# Patient Record
Sex: Female | Born: 1975 | Race: Black or African American | Hispanic: No | Marital: Married | Smoking: Never smoker
Health system: Southern US, Community
[De-identification: ages and names within clinical notes are randomized; demographics above are authoritative.]

## PROBLEM LIST (undated history)

## (undated) DIAGNOSIS — D509 Iron deficiency anemia, unspecified: Secondary | ICD-10-CM

---

## 2019-04-26 ENCOUNTER — Encounter: Payer: Self-pay | Admitting: Intensive Care

## 2019-04-26 ENCOUNTER — Emergency Department
Admission: EM | Admit: 2019-04-26 | Discharge: 2019-04-26 | Disposition: A | Discharge status: Home / Self Care | Attending: Emergency Medicine | Admitting: Emergency Medicine

## 2019-04-26 ENCOUNTER — Other Ambulatory Visit: Payer: Self-pay

## 2019-04-26 ENCOUNTER — Emergency Department

## 2019-04-26 DIAGNOSIS — K2970 Gastritis, unspecified, without bleeding: Secondary | ICD-10-CM | POA: Insufficient documentation

## 2019-04-26 DIAGNOSIS — K8020 Calculus of gallbladder without cholecystitis without obstruction: Secondary | ICD-10-CM | POA: Diagnosis not present

## 2019-04-26 DIAGNOSIS — K2900 Acute gastritis without bleeding: Secondary | ICD-10-CM

## 2019-04-26 DIAGNOSIS — R1013 Epigastric pain: Secondary | ICD-10-CM | POA: Diagnosis present

## 2019-04-26 LAB — URINALYSIS, COMPLETE (UACMP) WITH MICROSCOPIC
Bilirubin Urine: NEGATIVE
Glucose, UA: NEGATIVE mg/dL
Ketones, ur: NEGATIVE mg/dL
Nitrite: NEGATIVE
Protein, ur: 30 mg/dL — AB
Specific Gravity, Urine: 1.02 (ref 1.005–1.030)
pH: 5 (ref 5.0–8.0)

## 2019-04-26 LAB — COMPREHENSIVE METABOLIC PANEL
ALT: 14 U/L (ref 0–44)
AST: 16 U/L (ref 15–41)
Albumin: 4.3 g/dL (ref 3.5–5.0)
Alkaline Phosphatase: 76 U/L (ref 38–126)
Anion gap: 11 (ref 5–15)
BUN: 8 mg/dL (ref 6–20)
CO2: 25 mmol/L (ref 22–32)
Calcium: 9.4 mg/dL (ref 8.9–10.3)
Chloride: 103 mmol/L (ref 98–111)
Creatinine, Ser: 0.65 mg/dL (ref 0.44–1.00)
GFR calc Af Amer: >60 mL/min (ref >60)
GFR calc non Af Amer: >60 mL/min (ref >60)
Glucose, Bld: 147 mg/dL — ABNORMAL HIGH (ref 70–99)
Potassium: 3.5 mmol/L (ref 3.5–5.1)
Sodium: 139 mmol/L (ref 135–145)
Total Bilirubin: 0.4 mg/dL (ref 0.3–1.2)
Total Protein: 8.3 g/dL — ABNORMAL HIGH (ref 6.5–8.1)

## 2019-04-26 LAB — CBC
HCT: 34.8 % — ABNORMAL LOW (ref 36.0–46.0)
Hemoglobin: 11 g/dL — ABNORMAL LOW (ref 12.0–15.0)
MCH: 25.3 pg — ABNORMAL LOW (ref 26.0–34.0)
MCHC: 31.6 g/dL (ref 30.0–36.0)
MCV: 80 fL (ref 80.0–100.0)
Platelets: 430 10*3/uL — ABNORMAL HIGH (ref 150–400)
RBC: 4.35 MIL/uL (ref 3.87–5.11)
RDW: 15.5 % (ref 11.5–15.5)
WBC: 13.3 10*3/uL — ABNORMAL HIGH (ref 4.0–10.5)
nRBC: 0 % (ref 0.0–0.2)

## 2019-04-26 LAB — POCT PREGNANCY, URINE: Preg Test, Ur: NEGATIVE

## 2019-04-26 LAB — LIPASE, BLOOD: Lipase: 24 U/L (ref 11–51)

## 2019-04-26 MED ORDER — MORPHINE SULFATE (PF) 4 MG/ML IV SOLN
4.0000 mg | Freq: Once | INTRAVENOUS | Status: AC
  Administered 2019-04-26: 4 mg via INTRAVENOUS
  Filled 2019-04-26: qty 1

## 2019-04-26 MED ORDER — PANTOPRAZOLE SODIUM 20 MG PO TBEC: 20.0000 mg | DELAYED_RELEASE_TABLET | Freq: Every day | ORAL | 1 refills | Status: DC

## 2019-04-26 MED ORDER — LIDOCAINE VISCOUS HCL 2 % MT SOLN
15.0000 mL | Freq: Once | OROMUCOSAL | Status: AC
  Administered 2019-04-26: 15 mL via ORAL
  Filled 2019-04-26: qty 15

## 2019-04-26 MED ORDER — ONDANSETRON HCL 4 MG/2ML IJ SOLN
4.0000 mg | Freq: Once | INTRAMUSCULAR | Status: AC
  Administered 2019-04-26: 4 mg via INTRAVENOUS
  Filled 2019-04-26: qty 2

## 2019-04-26 MED ORDER — ALUM & MAG HYDROXIDE-SIMETH 200-200-20 MG/5ML PO SUSP
30.0000 mL | Freq: Once | ORAL | Status: AC
  Administered 2019-04-26: 30 mL via ORAL
  Filled 2019-04-26: qty 30

## 2019-04-26 NOTE — ED Provider Notes (Signed)
[facility] Emergency Department Provider Note   ____________________________________________    I have reviewed the triage vital signs and the nursing notes.   HISTORY  Chief Complaint Abdominal Pain and Emesis     HPI Ann Stevens is a 43 y.o. female who presents with complaints of abdominal pain, nausea and vomiting.  Patient describes the pain is in her epigastrium and started around 3 AM, she has had multiple episodes of vomiting since then.  She reports she is had a pain in this area a couple of times a month for some time always at night but usually goes away.  Denies a history of gallstones as far she knows.  Has not taken anything for this.  Describes burning pain in her epigastrium which radiates to her back.  Occasional alcohol use, non-smoker  History reviewed. No pertinent past medical history.  There are no active problems to display for this patient.   History reviewed. No pertinent surgical history.  Prior to Admission medications   Medication Sig Start Date End Date Taking? Authorizing Provider  pantoprazole (PROTONIX) 20 MG tablet Take 1 tablet (20 mg total) by mouth daily. 04/26/19 04/25/20  Lavonia Drafts, MD     Allergies Patient has no known allergies.  History reviewed. No pertinent family history.  Social History Social History   Tobacco Use  . Smoking status: Never Smoker  . Smokeless tobacco: Never Used  Substance Use Topics  . Alcohol use: Yes    Comment: occ  . Drug use: Never    Review of Systems  Constitutional: No fever/chills Eyes: No visual changes.  ENT: No sore throat. Cardiovascular: Denies chest pain. Respiratory: Denies shortness of breath. Gastrointestinal: As above Genitourinary: Negative for dysuria. Musculoskeletal: Negative for back pain. Skin: Negative for rash. Neurological: Negative for headaches or weakness   ____________________________________________   PHYSICAL EXAM:   VITAL SIGNS: ED Triage Vitals  Enc Vitals Group     BP 04/26/19 0711 (!) 151/101     Pulse Rate 04/26/19 0711 88     Resp 04/26/19 0711 16     Temp 04/26/19 0711 98.2 F (36.8 C)     Temp Source 04/26/19 0711 Oral     SpO2 04/26/19 0711 99 %     Weight 04/26/19 0707 81.6 kg (180 lb)     Height 04/26/19 0707 1.626 m (5\' 4" )     Head Circumference --      Peak Flow --      Pain Score 04/26/19 0706 10     Pain Loc --      Pain Edu? --      Excl. in [place]? --     Constitutional: Alert and oriented.  Eyes: Conjunctivae are normal.   Nose: No congestion/rhinnorhea. Mouth/Throat: Mucous membranes are moist.    Cardiovascular: Normal rate, regular rhythm. Grossly normal heart sounds.  Good peripheral circulation. Respiratory: Normal respiratory effort.  No retractions. Lungs CTAB. Gastrointestinal: Mild epigastric tenderness to palpation, soft overall, no distention, no CVA tenderness, no right upper quadrant tenderness  Musculoskeletal:   Warm and well perfused Neurologic:  Normal speech and language. No gross focal neurologic deficits are appreciated.  Skin:  Skin is warm, dry and intact. No rash noted. Psychiatric: Mood and affect are normal. Speech and behavior are normal.  ____________________________________________   LABS (all labs ordered are listed, but only abnormal results are displayed)  Labs Reviewed  COMPREHENSIVE METABOLIC PANEL - Abnormal; Notable for the following components:  Result Value   Glucose, Bld 147 (*)    Total Protein 8.3 (*)    All other components within normal limits  CBC - Abnormal; Notable for the following components:   WBC 13.3 (*)    Hemoglobin 11.0 (*)    HCT 34.8 (*)    MCH 25.3 (*)    Platelets 430 (*)    All other components within normal limits  URINALYSIS, COMPLETE (UACMP) WITH MICROSCOPIC - Abnormal; Notable for the following components:   Color, Urine YELLOW (*)    APPearance CLOUDY (*)    Hgb urine dipstick SMALL (*)     Protein, ur 30 (*)    Leukocytes,Ua MODERATE (*)    Bacteria, UA RARE (*)    All other components within normal limits  LIPASE, BLOOD  POC URINE PREG, ED  POCT PREGNANCY, URINE   ____________________________________________  EKG  None ____________________________________________  RADIOLOGY  Ultrasound demonstrates cholelithiasis, no cholecystitis ____________________________________________   PROCEDURES  Procedure(s) performed: No  Procedures   Critical Care performed: No ____________________________________________   INITIAL IMPRESSION / ASSESSMENT AND PLAN / ED COURSE  Pertinent labs & imaging results that were available during my care of the patient were reviewed by me and considered in my medical decision making (see chart for details).  Patient presents with epigastric pain as noted above.  Differential includes GERD/gastritis/PUD.  Less likely pancreatitis or cholelithiasis.  We will give GI cocktail, pending labs  Little improvement with GI cocktail however significant provement with IV morphine and IV Zofran.  Ultrasound demonstrates cholelithiasis.  She is asymptomatic at this point, symptoms may be related to biliary colic versus gastritis/PUD.  I will refer to GI, start her on PPI    ____________________________________________   FINAL CLINICAL IMPRESSION(S) / ED DIAGNOSES  Final diagnoses:  Epigastric pain  Calculus of gallbladder without cholecystitis without obstruction  Acute gastritis without hemorrhage, unspecified gastritis type        Note:  This document was prepared using Dragon voice recognition software and may include unintentional dictation errors.   Lavonia Drafts, MD 04/26/19 [phone]

## 2019-04-26 NOTE — ED Triage Notes (Signed)
Patient c/o abd pain with emesis since 0300 04/26/19

## 2019-04-26 NOTE — ED Notes (Signed)
Pt aware of need for urine, unable to obtain at this time

## 2019-04-26 NOTE — ED Notes (Signed)
Patient transported to Ultrasound 

## 2019-06-14 ENCOUNTER — Other Ambulatory Visit: Payer: Self-pay

## 2019-06-14 ENCOUNTER — Ambulatory Visit (INDEPENDENT_AMBULATORY_CARE_PROVIDER_SITE_OTHER): Admitting: Gastroenterology

## 2019-06-14 ENCOUNTER — Encounter: Payer: Self-pay | Admitting: Gastroenterology

## 2019-06-14 ENCOUNTER — Encounter

## 2019-06-14 VITALS — BP 134/70 | HR 79 | Temp 99.0°F | Ht 64.0 in | Wt 191.8 lb

## 2019-06-14 DIAGNOSIS — K5900 Constipation, unspecified: Secondary | ICD-10-CM

## 2019-06-14 DIAGNOSIS — R12 Heartburn: Secondary | ICD-10-CM

## 2019-06-14 DIAGNOSIS — R1011 Right upper quadrant pain: Secondary | ICD-10-CM

## 2019-06-14 DIAGNOSIS — G8929 Other chronic pain: Secondary | ICD-10-CM

## 2019-06-14 DIAGNOSIS — D509 Iron deficiency anemia, unspecified: Secondary | ICD-10-CM | POA: Diagnosis not present

## 2019-06-14 MED ORDER — PANTOPRAZOLE SODIUM 40 MG PO TBEC: 40.0000 mg | DELAYED_RELEASE_TABLET | Freq: Every day | ORAL | 3 refills | Status: DC

## 2019-06-14 NOTE — Patient Instructions (Signed)

## 2019-06-14 NOTE — Progress Notes (Signed)
Jonathon Bellows MD, MRCP(U.K) [address]  [place]  [place], [place] [address]  Main: [phone]  Fax: [phone]   Gastroenterology Consultation  Referring Provider:     No ref. provider found Primary Care Physician:  Burnard Hawthorne, FNP Primary Gastroenterologist:  Dr. Jonathon Bellows  Reason for Consultation:     Abdominal pain         HPI:   Ann Stevens is a 43 y.o. y/o female referred to see me after ER visit om 04/26/2019 with abdominal pain.   Right upper quadrant ultrasound: Moderate cholelithiasis, hepatic steatosis. 04/26/2019: Urinalysis shows features of UTI.  Hemoglobin of 11 with MCV of 80.  LFTs normal, lipase normal She says she has had heartburn and right upper quadrant pain for the past 1 year.  Gradually getting worse.  The heartburn is usually at night which wakes her up from sleep.  She initially took some Protonix for 30 days and stopped.  While on Protonix her heartburn had resolved.  She continues to have on and off right upper quadrant pain, localized, spasmic, nonradiating, no clear aggravating or relieving factors.  She does not have a bowel movement every day.  Does not claim she is constipated.  Does not have any relief with an adequate bowel movement.  Some gas and bloating.  Denies any regular NSAID use.  She had an aunt who had issues with her gallbladder.  She has her cycles every 3 to 4 weeks.  Very heavy initially and has to change multiple pads and tampons.  Denies any rectal bleeding.  Denies any blood in the urine.  She is on iron tablets.  She has not seen a GYN.   No past medical history on file.  No past surgical history on file.  Prior to Admission medications   Medication Sig Start Date End Date Taking? Authorizing Provider  pantoprazole (PROTONIX) 20 MG tablet Take 1 tablet (20 mg total) by mouth daily. 04/26/19 04/25/20  Lavonia Drafts, MD    No family history on file.   Social History   Tobacco Use  . Smoking status: Never  Smoker  . Smokeless tobacco: Never Used  Substance Use Topics  . Alcohol use: Yes    Comment: occ  . Drug use: Never    Allergies as of 06/14/2019  . (No Known Allergies)    Review of Systems:    All systems reviewed and negative except where noted in HPI.   Physical Exam:  BP 134/70   Pulse 79   Temp 99 F (37.2 C)   Ht 5\' 4"  (1.626 m)   Wt 191 lb 12.8 oz (87 kg)   BMI 32.92 kg/m  No LMP recorded. (Menstrual status: Oral contraceptives). Psych:  Alert and cooperative. Normal mood and affect. General:   Alert,  Well-developed, well-nourished, pleasant and cooperative in NAD Head:  Normocephalic and atraumatic. Eyes:  Sclera clear, no icterus.   Conjunctiva pink. Ears:  Normal auditory acuity. Nose:  No deformity, discharge, or lesions. Mouth:  No deformity or lesions,oropharynx pink & moist. Neck:  Supple; no masses or thyromegaly. Lungs:  Respirations even and unlabored.  Clear throughout to auscultation.   No wheezes, crackles, or rhonchi. No acute distress. Heart:  Regular rate and rhythm; no murmurs, clicks, rubs, or gallops. Abdomen:  Normal bowel sounds.  No bruits.  Soft, non-tender and non-distended without masses, hepatosplenomegaly or hernias noted.  No guarding or rebound tenderness.    Neurologic:  Alert and oriented x3;  grossly normal neurologically. Skin:  Intact without significant lesions or rashes. No jaundice. Lymph Nodes:  No significant cervical adenopathy. Psych:  Alert and cooperative. Normal mood and affect.  Imaging Studies: No results found.  Assessment and Plan:   Ann Stevens is a 43 y.o. y/o female has been referred for abdominal pain after recent ER visit.  Based on her history I suspect she has 2 issues: Heartburn likely due to acid reflux.  Discussed lifestyle changes.,  Commence on Protonix 40 mg once a day before breakfast as a trial.  She does have right upper quadrant pain which is very nonspecific.  Does not have a typical biliary  description.  It may be related to gallbladder or from constipation.  I would suggest to recommend 1 capful of MiraLAX daily.  If no better in 4 weeks time consider HIDA scan.  She has microcytic anemia.  Based on history likely menorrhagia.  I will check B12 folate and iron studies.  If low I would recommend replacement as well as referral to GYN.  I also provide information on a high-fiber diet  Follow up in 4 weeks video visit  Dr Jonathon Bellows MD,MRCP(U.K)

## 2019-06-15 ENCOUNTER — Encounter: Payer: Self-pay | Admitting: Gastroenterology

## 2019-06-15 ENCOUNTER — Other Ambulatory Visit: Payer: Self-pay

## 2019-06-15 ENCOUNTER — Telehealth: Payer: Self-pay

## 2019-06-15 DIAGNOSIS — D509 Iron deficiency anemia, unspecified: Secondary | ICD-10-CM

## 2019-06-15 LAB — B12 AND FOLATE PANEL
Folate: 12.3 ng/mL (ref >3.0)
Vitamin B-12: 407 pg/mL (ref 232–1245)

## 2019-06-15 LAB — IRON,TIBC AND FERRITIN PANEL
Ferritin: 18 ng/mL (ref 15–150)
Iron Saturation: 5 % — CL (ref 15–55)
Iron: 23 ug/dL — ABNORMAL LOW (ref 27–159)
Total Iron Binding Capacity: 419 ug/dL (ref 250–450)
UIBC: 396 ug/dL (ref 131–425)

## 2019-06-15 MED ORDER — FERROUS SULFATE 325 (65 FE) MG PO TBEC: 325.0000 mg | DELAYED_RELEASE_TABLET | Freq: Two times a day (BID) | ORAL | 1 refills | Status: DC

## 2019-06-15 NOTE — Progress Notes (Signed)
Jadijah Inform that the iron is very low, she needs to take her iron tablets daily ferrous sulfate 325 mg twice daily.  I would also suggest her to see a GYN as she has significant menorrhagia.  Dr Jonathon Bellows MD,MRCP [facility]) Gastroenterology/Hepatology Pager: [phone]

## 2019-06-15 NOTE — Telephone Encounter (Signed)
-----   Message from Jonathon Bellows, MD sent at 06/15/2019  9:51 AM EST ----- Ann Stevens Inform that the iron is very low, she needs to take her iron tablets daily ferrous sulfate 325 mg twice daily.  I would also suggest her to see a GYN as she has significant menorrhagia.  Dr Jonathon Bellows MD,MRCP [facility]) Gastroenterology/Hepatology Pager: [phone]

## 2019-06-15 NOTE — Telephone Encounter (Signed)
Spoke with pt and informed her of lab results and Dr. Georgeann Oppenheim recommendations. Pt agrees and requests a prescription for the iron tablets. Pt has also agreed to a referral to [facility].

## 2019-06-21 ENCOUNTER — Telehealth: Payer: Self-pay | Admitting: Obstetrics & Gynecology

## 2019-06-21 NOTE — Telephone Encounter (Signed)
[facility] referring for Iron deficiency anemia, unspecified iron deficiency anemia type. Attempt to reach patient no voicemail set up unable to leave message.

## 2019-07-06 ENCOUNTER — Telehealth: Payer: Self-pay

## 2019-07-06 NOTE — Telephone Encounter (Signed)
Pt called requesting Dr. Georgeann Oppenheim advice. Pt states yesterday she began experiencing a constant dull ache in her mid upper abdomen that radiates into her back. Pt states her last bowel movement was yesterday, soft stool, no diarrhea. She's taking Miralax daily for constipation and is also taking daily protonix. No nausea or vomiting. Pt requests advice on what she should do. I explained that I will relay this to Dr. Vicente Males for recommendations.

## 2019-07-07 ENCOUNTER — Other Ambulatory Visit: Payer: Self-pay

## 2019-07-07 DIAGNOSIS — G8929 Other chronic pain: Secondary | ICD-10-CM

## 2019-07-07 DIAGNOSIS — R1011 Right upper quadrant pain: Secondary | ICD-10-CM

## 2019-07-07 DIAGNOSIS — R1010 Upper abdominal pain, unspecified: Secondary | ICD-10-CM

## 2019-07-07 MED ORDER — DICYCLOMINE HCL 10 MG PO CAPS: 10.0000 mg | ORAL_CAPSULE | Freq: Three times a day (TID) | ORAL | 2 refills | Status: DC

## 2019-07-07 NOTE — Telephone Encounter (Signed)
Check if on PPI, if yes then [proceed with HIDA, give bentyl PRN upto 4 times daily for pain 10 mg .   If no better in 4-5 days then video visit next Wednesday with me   Bailey Mech

## 2019-07-07 NOTE — Telephone Encounter (Signed)
Spoke with pt and informed her of Dr. Georgeann Oppenheim recommendations. Pt agrees. Pt is aware that the Bentyl prescription has been sent to pharmacy and HIDA scan appointment has been scheduled.

## 2019-07-08 ENCOUNTER — Other Ambulatory Visit: Payer: Self-pay

## 2019-07-08 ENCOUNTER — Encounter: Payer: Self-pay | Admitting: Emergency Medicine

## 2019-07-08 ENCOUNTER — Emergency Department

## 2019-07-08 ENCOUNTER — Inpatient Hospital Stay
Admission: EM | Admit: 2019-07-08 | Discharge: 2019-07-11 | DRG: 417 | Disposition: A | Discharge status: Home / Self Care | Attending: Internal Medicine | Admitting: Internal Medicine

## 2019-07-08 DIAGNOSIS — K8000 Calculus of gallbladder with acute cholecystitis without obstruction: Principal | ICD-10-CM | POA: Diagnosis present

## 2019-07-08 DIAGNOSIS — K8043 Calculus of bile duct with acute cholecystitis with obstruction: Secondary | ICD-10-CM

## 2019-07-08 DIAGNOSIS — K851 Biliary acute pancreatitis without necrosis or infection: Secondary | ICD-10-CM | POA: Diagnosis present

## 2019-07-08 DIAGNOSIS — R109 Unspecified abdominal pain: Secondary | ICD-10-CM

## 2019-07-08 DIAGNOSIS — E669 Obesity, unspecified: Secondary | ICD-10-CM | POA: Diagnosis present

## 2019-07-08 DIAGNOSIS — Z98891 History of uterine scar from previous surgery: Secondary | ICD-10-CM | POA: Diagnosis not present

## 2019-07-08 DIAGNOSIS — Z6829 Body mass index (BMI) 29.0-29.9, adult: Secondary | ICD-10-CM

## 2019-07-08 DIAGNOSIS — E860 Dehydration: Secondary | ICD-10-CM | POA: Diagnosis present

## 2019-07-08 DIAGNOSIS — I959 Hypotension, unspecified: Secondary | ICD-10-CM | POA: Diagnosis present

## 2019-07-08 DIAGNOSIS — D509 Iron deficiency anemia, unspecified: Secondary | ICD-10-CM | POA: Diagnosis present

## 2019-07-08 DIAGNOSIS — K810 Acute cholecystitis: Secondary | ICD-10-CM | POA: Diagnosis present

## 2019-07-08 DIAGNOSIS — Z20828 Contact with and (suspected) exposure to other viral communicable diseases: Secondary | ICD-10-CM | POA: Diagnosis present

## 2019-07-08 DIAGNOSIS — Z79899 Other long term (current) drug therapy: Secondary | ICD-10-CM

## 2019-07-08 DIAGNOSIS — K219 Gastro-esophageal reflux disease without esophagitis: Secondary | ICD-10-CM | POA: Diagnosis present

## 2019-07-08 DIAGNOSIS — R1011 Right upper quadrant pain: Secondary | ICD-10-CM | POA: Diagnosis present

## 2019-07-08 DIAGNOSIS — K760 Fatty (change of) liver, not elsewhere classified: Secondary | ICD-10-CM | POA: Diagnosis present

## 2019-07-08 DIAGNOSIS — Z419 Encounter for procedure for purposes other than remedying health state, unspecified: Secondary | ICD-10-CM

## 2019-07-08 DIAGNOSIS — K859 Acute pancreatitis without necrosis or infection, unspecified: Secondary | ICD-10-CM

## 2019-07-08 DIAGNOSIS — K8020 Calculus of gallbladder without cholecystitis without obstruction: Secondary | ICD-10-CM

## 2019-07-08 HISTORY — DX: Iron deficiency anemia, unspecified: D50.9

## 2019-07-08 LAB — COMPREHENSIVE METABOLIC PANEL
ALT: 154 U/L — ABNORMAL HIGH (ref 0–44)
AST: 97 U/L — ABNORMAL HIGH (ref 15–41)
Albumin: 4 g/dL (ref 3.5–5.0)
Alkaline Phosphatase: 131 U/L — ABNORMAL HIGH (ref 38–126)
Anion gap: 14 (ref 5–15)
BUN: 11 mg/dL (ref 6–20)
CO2: 22 mmol/L (ref 22–32)
Calcium: 9.2 mg/dL (ref 8.9–10.3)
Chloride: 102 mmol/L (ref 98–111)
Creatinine, Ser: 0.7 mg/dL (ref 0.44–1.00)
GFR calc Af Amer: >60 mL/min (ref >60)
GFR calc non Af Amer: >60 mL/min (ref >60)
Glucose, Bld: 112 mg/dL — ABNORMAL HIGH (ref 70–99)
Potassium: 3.7 mmol/L (ref 3.5–5.1)
Sodium: 138 mmol/L (ref 135–145)
Total Bilirubin: 4.5 mg/dL — ABNORMAL HIGH (ref 0.3–1.2)
Total Protein: 8.5 g/dL — ABNORMAL HIGH (ref 6.5–8.1)

## 2019-07-08 LAB — CBC
HCT: 39.5 % (ref 36.0–46.0)
Hemoglobin: 12.2 g/dL (ref 12.0–15.0)
MCH: 24.8 pg — ABNORMAL LOW (ref 26.0–34.0)
MCHC: 30.9 g/dL (ref 30.0–36.0)
MCV: 80.3 fL (ref 80.0–100.0)
Platelets: 354 10*3/uL (ref 150–400)
RBC: 4.92 MIL/uL (ref 3.87–5.11)
RDW: 17.1 % — ABNORMAL HIGH (ref 11.5–15.5)
WBC: 9.9 10*3/uL (ref 4.0–10.5)
nRBC: 0 % (ref 0.0–0.2)

## 2019-07-08 LAB — URINALYSIS, COMPLETE (UACMP) WITH MICROSCOPIC
Bacteria, UA: NONE SEEN
Bilirubin Urine: NEGATIVE
Glucose, UA: NEGATIVE mg/dL
Ketones, ur: 80 mg/dL — AB
Nitrite: NEGATIVE
Protein, ur: NEGATIVE mg/dL
Specific Gravity, Urine: 1.018 (ref 1.005–1.030)
pH: 5 (ref 5.0–8.0)

## 2019-07-08 LAB — RESPIRATORY PANEL BY RT PCR (FLU A&B, COVID)
Influenza A by PCR: NEGATIVE
Influenza B by PCR: NEGATIVE
SARS Coronavirus 2 by RT PCR: NEGATIVE

## 2019-07-08 LAB — POCT PREGNANCY, URINE: Preg Test, Ur: NEGATIVE

## 2019-07-08 LAB — LIPASE, BLOOD: Lipase: 2287 U/L — ABNORMAL HIGH (ref 11–51)

## 2019-07-08 MED ORDER — GADOBUTROL 1 MMOL/ML IV SOLN
7.5000 mL | Freq: Once | INTRAVENOUS | Status: AC | PRN
  Administered 2019-07-08: 7.5 mL via INTRAVENOUS

## 2019-07-08 MED ORDER — HYDROMORPHONE HCL 1 MG/ML IJ SOLN
1.0000 mg | INTRAMUSCULAR | Status: DC | PRN
  Administered 2019-07-08 – 2019-07-10 (×8): 1 mg via INTRAVENOUS
  Filled 2019-07-08 (×8): qty 1

## 2019-07-08 MED ORDER — ONDANSETRON HCL 4 MG/2ML IJ SOLN
4.0000 mg | Freq: Once | INTRAMUSCULAR | Status: AC
  Administered 2019-07-08: 4 mg via INTRAVENOUS
  Filled 2019-07-08: qty 2

## 2019-07-08 MED ORDER — PIPERACILLIN-TAZOBACTAM 3.375 G IVPB 30 MIN: 3.3750 g | Freq: Three times a day (TID) | INTRAVENOUS | Status: DC

## 2019-07-08 MED ORDER — ONDANSETRON HCL 4 MG PO TABS
4.0000 mg | ORAL_TABLET | Freq: Four times a day (QID) | ORAL | Status: DC | PRN
  Filled 2019-07-08: qty 1

## 2019-07-08 MED ORDER — SODIUM CHLORIDE 0.9 % IV SOLN: Freq: Once | INTRAVENOUS | Status: AC

## 2019-07-08 MED ORDER — LACTATED RINGERS IV SOLN: INTRAVENOUS | Status: DC

## 2019-07-08 MED ORDER — MORPHINE SULFATE (PF) 4 MG/ML IV SOLN
4.0000 mg | Freq: Once | INTRAVENOUS | Status: AC
  Administered 2019-07-08: 4 mg via INTRAVENOUS
  Filled 2019-07-08: qty 1

## 2019-07-08 MED ORDER — SODIUM CHLORIDE 0.9 % IV BOLUS
1000.0000 mL | Freq: Once | INTRAVENOUS | Status: AC
  Administered 2019-07-08: 1000 mL via INTRAVENOUS

## 2019-07-08 MED ORDER — PIPERACILLIN-TAZOBACTAM 3.375 G IVPB 30 MIN
3.3750 g | Freq: Once | INTRAVENOUS | Status: AC
  Administered 2019-07-08: 3.375 g via INTRAVENOUS
  Filled 2019-07-08: qty 50

## 2019-07-08 MED ORDER — ENOXAPARIN SODIUM 40 MG/0.4ML ~~LOC~~ SOLN
40.0000 mg | SUBCUTANEOUS | Status: DC
  Administered 2019-07-08 – 2019-07-10 (×3): 40 mg via SUBCUTANEOUS
  Filled 2019-07-08 (×2): qty 0.4

## 2019-07-08 MED ORDER — ONDANSETRON HCL 4 MG/2ML IJ SOLN
4.0000 mg | Freq: Four times a day (QID) | INTRAMUSCULAR | Status: DC | PRN
  Administered 2019-07-10: 4 mg via INTRAVENOUS

## 2019-07-08 MED ORDER — FENTANYL CITRATE (PF) 100 MCG/2ML IJ SOLN
100.0000 ug | Freq: Once | INTRAMUSCULAR | Status: AC
  Administered 2019-07-08: 100 ug via INTRAVENOUS
  Filled 2019-07-08: qty 2

## 2019-07-08 NOTE — ED Provider Notes (Signed)
MRCP returned and showed signs of acute pancreatitis as well as possible gallstones or sludge in the common bile duct.  I discussed with Dr. Marius Ditch with GI about these findings. At this point no concern for cholangitis. Given that preference would be to help treat the pancreatic inflammation before ERCP she felt comfortable keep the patient here at [facility]  I discussed this with the patient.  I also discussed possibility of going to center with ERCP if she was to need a more emergent ERCP.  She felt comfortable being admitted here and then having a slight delay of transfer is needed later.  Will plan on admission here.   Nance Pear, MD 07/08/19 [phone]

## 2019-07-08 NOTE — H&P (Signed)
History and Physical   Ann Stevens [id number] DOB: 02-11-1976 DOA: 07/08/2019  Referring MD/NP/PA: Dr. Archie Balboa  PCP: Burnard Hawthorne, FNP   Outpatient Specialists: None  Patient coming from: Home  Chief Complaint: Abdominal pain  HPI: Ann Stevens is a 43 y.o. female with medical history significant of iron deficiency anemia who presents to the ER with sudden onset of right upper quadrant abdominal pain in the last 2 days.  She had similar symptoms 2 months ago but resolved spontaneously.  But this time around pain was as high as 9 out of 10.  Associated with some nausea and vomiting.  Denied any fever.  Denied any diarrhea,.  Patient also denied any sick contact.  Denied any prior abdominal surgeries.  Denied taking nonsteroidal anti-inflammatory agents.  No melena or bright red blood per rectum and no hematemesis.  She was seen in the ER and evaluated.  Patient had markedly elevated lipase enzyme with imaging showing acute pancreatitis with choledocholithiasis.  GI consulted and recommendations to admit patient for possible ERCP on Monday..  ED Course: Temperature is 99.1 blood pressure 83/66 pulse 59 respirate 19 oxygen sat 93% on room air.  Urinalysis showed leukocytosis esterase with WBC 11-20 no bacteria.  Chemistry showed glucose 112 alkaline phosphatase 131 and lipase 2287.  AST 97 ALT 154 total bilirubin 4.5.  CBC is within normal.  Shows signs of acute interstitial pancreatitis with numerous gallstones in the gallbladder and gallbladder neck mild to moderate intrahepatic biliary ductal distention with sludge and common bile duct which is at 5 mm.  Right upper quadrant abdominal ultrasound showed cholelithiasis and sludge within the gallbladder with thickened gallbladder wall and pericholecystic fluid findings concerning for acute cholecystitis.  GI consulted patient initiated pain management and antibiotics and is being admitted for treatment.  Review of Systems: As per HPI  otherwise 10 point review of systems negative.    Past Medical History:  Diagnosis Date  . Anemia, iron deficiency     Past Surgical History:  Procedure Laterality Date  . CESAREAN SECTION       reports that she has never smoked. She has never used smokeless tobacco. She reports current alcohol use. She reports that she does not use drugs.  No Known Allergies  History reviewed. No pertinent family history.   Prior to Admission medications   Medication Sig Start Date End Date Taking? Authorizing Provider  dicyclomine (BENTYL) 10 MG capsule Take 1 capsule (10 mg total) by mouth 4 (four) times daily -  before meals and at bedtime. As needed for pain 07/07/19 10/05/19 Yes Jonathon Bellows, MD  ferrous sulfate 325 (65 FE) MG EC tablet Take 1 tablet (325 mg total) by mouth 2 (two) times daily. 06/15/19 12/12/19 Yes Jonathon Bellows, MD  norgestimate-ethinyl estradiol (MILI) 0.25-35 MG-MCG tablet Take 1 tablet by mouth daily.   Yes [provider]  pantoprazole (PROTONIX) 40 MG tablet Take 1 tablet (40 mg total) by mouth daily. 06/14/19  Yes Jonathon Bellows, MD    Physical Exam: Vitals:   07/08/19 1945 07/08/19 2015 07/08/19 2030 07/08/19 2130  BP:      Pulse: 72 63 65   Resp: 19 15 16 19   Temp:      TempSrc:      SpO2: 98% 93% 96%   Weight:      Height:          Constitutional: NAD, calm, comfortable Vitals:   07/08/19 1945 07/08/19 2015 07/08/19 2030 07/08/19 2130  BP:  Pulse: 72 63 65   Resp: 19 15 16 19   Temp:      TempSrc:      SpO2: 98% 93% 96%   Weight:      Height:       Eyes: PERRL, lids and conjunctivae normal ENMT: Mucous membranes are moist. Posterior pharynx clear of any exudate or lesions.Normal dentition.  Neck: normal, supple, no masses, no thyromegaly Respiratory: clear to auscultation bilaterally, no wheezing, no crackles. Normal respiratory effort. No accessory muscle use.  Cardiovascular: Regular rate and rhythm, no murmurs / rubs / gallops. No  extremity edema. 2+ pedal pulses. No carotid bruits.  Abdomen: Diffuse epigastric to right upper quadrant abdominal tenderness,, no masses palpated. No hepatosplenomegaly. Bowel sounds positive.  Musculoskeletal: no clubbing / cyanosis. No joint deformity upper and lower extremities. Good ROM, no contractures. Normal muscle tone.  Skin: no rashes, lesions, ulcers. No induration Neurologic: CN 2-12 grossly intact. Sensation intact, DTR normal. Strength 5/5 in all 4.  Psychiatric: Normal judgment and insight. Alert and oriented x 3. Normal mood.     Labs on Admission: I have personally reviewed following labs and imaging studies  CBC: Recent Labs  Lab 07/08/19 1130  WBC 9.9  HGB 12.2  HCT 39.5  MCV 80.3  PLT [id number]   Basic Metabolic Panel: Recent Labs  Lab 07/08/19 1130  NA 138  K 3.7  CL 102  CO2 22  GLUCOSE 112*  BUN 11  CREATININE 0.70  CALCIUM 9.2   GFR: Estimated Creatinine Clearance: 95.6 mL/min (by C-G formula based on SCr of 0.7 mg/dL). Liver Function Tests: Recent Labs  Lab 07/08/19 1130  AST 97*  ALT 154*  ALKPHOS 131*  BILITOT 4.5*  PROT 8.5*  ALBUMIN 4.0   Recent Labs  Lab 07/08/19 1130  LIPASE 2,287*   No results for input(s): AMMONIA in the last 168 hours. Coagulation Profile: No results for input(s): INR, PROTIME in the last 168 hours. Cardiac Enzymes: No results for input(s): CKTOTAL, CKMB, CKMBINDEX, TROPONINI in the last 168 hours. BNP (last 3 results) No results for input(s): PROBNP in the last 8760 hours. HbA1C: No results for input(s): HGBA1C in the last 72 hours. CBG: No results for input(s): GLUCAP in the last 168 hours. Lipid Profile: No results for input(s): CHOL, HDL, LDLCALC, TRIG, CHOLHDL, LDLDIRECT in the last 72 hours. Thyroid Function Tests: No results for input(s): TSH, T4TOTAL, FREET4, T3FREE, THYROIDAB in the last 72 hours. Anemia Panel: No results for input(s): VITAMINB12, FOLATE, FERRITIN, TIBC, IRON, RETICCTPCT in the  last 72 hours. Urine analysis:    Component Value Date/Time   COLORURINE YELLOW (A) 07/08/2019 1748   APPEARANCEUR CLEAR (A) 07/08/2019 1748   LABSPEC 1.018 07/08/2019 1748   PHURINE 5.0 07/08/2019 1748   GLUCOSEU NEGATIVE 07/08/2019 1748   HGBUR MODERATE (A) 07/08/2019 [place] 07/08/2019 1748   KETONESUR 80 (A) 07/08/2019 1748   PROTEINUR NEGATIVE 07/08/2019 1748   NITRITE NEGATIVE 07/08/2019 1748   LEUKOCYTESUR TRACE (A) 07/08/2019 1748   Sepsis Labs: @LABRCNTIP (procalcitonin:4,lacticidven:4) ) Recent Results (from the past 240 hour(s))  Respiratory Panel by RT PCR (Flu A&B, Covid) - Nasopharyngeal Swab     Status: None   Collection Time: 07/08/19  2:19 PM   Specimen: Nasopharyngeal Swab  Result Value Ref Range Status   SARS Coronavirus 2 by RT PCR NEGATIVE NEGATIVE Final    Comment: (NOTE) SARS-CoV-2 target nucleic acids are NOT DETECTED. The SARS-CoV-2 RNA is generally detectable in upper respiratoy  specimens during the acute phase of infection. The lowest concentration of SARS-CoV-2 viral copies this assay can detect is 131 copies/mL. A negative result does not preclude SARS-Cov-2 infection and should not be used as the sole basis for treatment or other patient management decisions. A negative result may occur with  improper specimen collection/handling, submission of specimen other than nasopharyngeal swab, presence of viral mutation(s) within the areas targeted by this assay, and inadequate number of viral copies (<131 copies/mL). A negative result must be combined with clinical observations, patient history, and epidemiological information. The expected result is Negative. Fact Sheet for Patients:  PinkCheek.be Fact Sheet for Healthcare Providers:  GravelBags.it This test is not yet ap proved or cleared by the [place] FDA and  has been authorized for detection and/or diagnosis of  SARS-CoV-2 by FDA under an Emergency Use Authorization (EUA). This EUA will remain  in effect (meaning this test can be used) for the duration of the COVID-19 declaration under Section 564(b)(1) of the Act, 21 U.S.C. section 360bbb-3(b)(1), unless the authorization is terminated or revoked sooner.    Influenza A by PCR NEGATIVE NEGATIVE Final   Influenza B by PCR NEGATIVE NEGATIVE Final    Comment: (NOTE) The Xpert Xpress SARS-CoV-2/FLU/RSV assay is intended as an aid in  the diagnosis of influenza from Nasopharyngeal swab specimens and  should not be used as a sole basis for treatment. Nasal washings and  aspirates are unacceptable for Xpert Xpress SARS-CoV-2/FLU/RSV  testing. Fact Sheet for Patients: PinkCheek.be Fact Sheet for Healthcare Providers: GravelBags.it This test is not yet approved or cleared by the [place] FDA and  has been authorized for detection and/or diagnosis of SARS-CoV-2 by  FDA under an Emergency Use Authorization (EUA). This EUA will remain  in effect (meaning this test can be used) for the duration of the  Covid-19 declaration under Section 564(b)(1) of the Act, 21  U.S.C. section 360bbb-3(b)(1), unless the authorization is  terminated or revoked. Performed at [facility], [address] [place], [place] [address]      Radiological Exams on Admission: MR 3D Recon At Scanner  Result Date: 07/08/2019 CLINICAL DATA:  Acute abdominal pain, generalized. EXAM: MRI ABDOMEN WITH CONTRAST (WITH MRCP) TECHNIQUE: Multiplanar multisequence MR imaging of the abdomen was performed following the administration of intravenous contrast. Heavily T2-weighted images of the biliary and pancreatic ducts were obtained, and three-dimensional MRCP images were rendered by post processing. CONTRAST:  7.69mL GADAVIST GADOBUTROL 1 MMOL/ML IV SOLN COMPARISON:  Ultrasound abdomen 07/08/2019 FINDINGS: Lower chest:  Incidental imaging of the lung bases is unremarkable. Limited assessment on MRI. Hepatobiliary: Numerous gallstones layering dependently in the gallbladder also mixed with sludge. No signs of focal hepatic lesion. Gallbladder and gallbladder neck or filled with sludge. The cystic duct appears to join the common bile duct in the proximal third of the extrahepatic biliary tree. Just above the confluence there is sludge layering dependently in the common hepatic duct. There may also be some stones and or sludge layering dependently in the distal common bile duct. Minimal intrahepatic biliary ductal distension is present. The common bile duct measures 5 mm. Pancreas: Signs of acute interstitial pancreatitis. No focal pancreatic fluid, stranding throughout the anterior pararenal space. Pancreatic enhancement is maintained. The splenic vein is patent. Spleen:  Normal appearance of the spleen. Adrenals/Urinary Tract: Adrenal glands are normal. Renal contours are smooth. Stomach/Bowel: Perigastric and perienteric stranding in the setting of acute pancreatitis. No signs of acute bowel process. Vascular/Lymphatic: Patent abdominal  vasculature. 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 Other:  None. Musculoskeletal: No suspicious bone lesions identified. IMPRESSION: 1. Signs of acute interstitial pancreatitis. 2. Numerous gallstones layering dependently in the gallbladder and gallbladder neck. The cystic duct appears to join the common bile duct in the proximal third of the extrahepatic biliary tree. 3. Mild to moderate intrahepatic biliary ductal distension. Sludge and/or small stones in the common hepatic duct and common bile duct as described. Common bile duct is at 5 mm currently. 4. 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 Electronically Signed   By: Zetta Bills M.D.   On: 07/08/2019 16:08   MR ABDOMEN WITH MRCP W CONTRAST  Result Date: 07/08/2019 CLINICAL DATA:  Acute abdominal pain,  generalized. EXAM: MRI ABDOMEN WITH CONTRAST (WITH MRCP) TECHNIQUE: Multiplanar multisequence MR imaging of the abdomen was performed following the administration of intravenous contrast. Heavily T2-weighted images of the biliary and pancreatic ducts were obtained, and three-dimensional MRCP images were rendered by post processing. CONTRAST:  7.60mL GADAVIST GADOBUTROL 1 MMOL/ML IV SOLN COMPARISON:  Ultrasound abdomen 07/08/2019 FINDINGS: Lower chest: Incidental imaging of the lung bases is unremarkable. Limited assessment on MRI. Hepatobiliary: Numerous gallstones layering dependently in the gallbladder also mixed with sludge. No signs of focal hepatic lesion. Gallbladder and gallbladder neck or filled with sludge. The cystic duct appears to join the common bile duct in the proximal third of the extrahepatic biliary tree. Just above the confluence there is sludge layering dependently in the common hepatic duct. There may also be some stones and or sludge layering dependently in the distal common bile duct. Minimal intrahepatic biliary ductal distension is present. The common bile duct measures 5 mm. Pancreas: Signs of acute interstitial pancreatitis. No focal pancreatic fluid, stranding throughout the anterior pararenal space. Pancreatic enhancement is maintained. The splenic vein is patent. Spleen:  Normal appearance of the spleen. Adrenals/Urinary Tract: Adrenal glands are normal. Renal contours are smooth. Stomach/Bowel: Perigastric and perienteric stranding in the setting of acute pancreatitis. No signs of acute bowel process. Vascular/Lymphatic: Patent abdominal vasculature. 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 Other:  None. Musculoskeletal: No suspicious bone lesions identified. IMPRESSION: 1. Signs of acute interstitial pancreatitis. 2. Numerous gallstones layering dependently in the gallbladder and gallbladder neck. The cystic duct appears to join the common bile duct in the proximal third of  the extrahepatic biliary tree. 3. Mild to moderate intrahepatic biliary ductal distension. Sludge and/or small stones in the common hepatic duct and common bile duct as described. Common bile duct is at 5 mm currently. 4. 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 Electronically Signed   By: Zetta Bills M.D.   On: 07/08/2019 16:08   US Abdomen Limited RUQ  Result Date: 07/08/2019 CLINICAL DATA:  Upper abdominal pain with nausea and vomiting EXAM: ULTRASOUND ABDOMEN LIMITED RIGHT UPPER QUADRANT COMPARISON:  Ultrasound right upper quadrant April 26, 2019 FINDINGS: Gallbladder: Within the gallbladder, there are multiple echogenic foci which move and shadow consistent with cholelithiasis. Largest individual gallstone measures 7 mm in length. There is also sludge in the gallbladder. The gallbladder wall is borderline thickened with trace pericholecystic fluid. No sonographic Murphy sign noted by sonographer. Common bile duct: Diameter: 4 mm. No intrahepatic or extrahepatic biliary duct dilatation. Liver: No focal lesion identified. Within normal limits in parenchymal echogenicity. Portal vein is patent on color Doppler imaging with normal direction of blood flow towards the liver. Other: None. IMPRESSION: Cholelithiasis and sludge within the gallbladder. Gallbladder wall  borderline thickened with trace pericholecystic fluid. These findings are concerning for a degree of acute cholecystitis. Study otherwise unremarkable. Electronically Signed   By: Lowella Grip III M.D.   On: 07/08/2019 13:22     Assessment/Plan Principal Problem:   Pancreatitis, gallstone Active Problems:   Iron deficiency anemia   Acute cholecystitis     #1 acute gallstone pancreatitis: Patient will be admitted.  Bowel rest with complete n.p.o.  IV fluids pain management nausea vomiting management.  Once lipase is improved initial oral therapy.  Once pancreatitis resolves patient may require ERCP and ultimately  cholecystectomy.  #2 acute cholecystitis: Secondary to gallstones.  Initiate IV antibiotics and treatment as above.  #3 iron deficiency anemia: Continue monitoring H&H.   DVT prophylaxis: Lovenox Code Status: Full code Family Communication: No family at bedside Disposition Plan: Home Consults called: Dr. Marius Ditch, gastroenterology Admission status: Inpatient  Severity of Illness: The appropriate patient status for this patient is INPATIENT. Inpatient status is judged to be reasonable and necessary in order to provide the required intensity of service to ensure the patient's safety. The patient's presenting symptoms, physical exam findings, and initial radiographic and laboratory data in the context of their chronic comorbidities is felt to place them at high risk for further clinical deterioration. Furthermore, it is not anticipated that the patient will be medically stable for discharge from the hospital within 2 midnights of admission. The following factors support the patient status of inpatient.   " The patient's presenting symptoms include abdominal pain with nausea vomit. " The worrisome physical exam findings include epigastric tenderness. " The initial radiographic and laboratory data are worrisome because of MRCP showing pancreatitis. " The chronic co-morbidities include iron deficiency anemia.   * I certify that at the point of admission it is my clinical judgment that the patient will require inpatient hospital care spanning beyond 2 midnights from the point of admission due to high intensity of service, high risk for further deterioration and high frequency of surveillance required.Barbette Merino MD [facility] Pager [phone]  If 7PM-7AM, please contact night-coverage [email or web address] Password [facility]  07/08/2019, 10:22 PM

## 2019-07-08 NOTE — ED Provider Notes (Signed)
[facility] Emergency Department Provider Note  Time seen: 12:09 PM  I have reviewed the triage vital signs and the nursing notes.   HISTORY  Chief Complaint Abdominal Pain   HPI Ann Stevens is a 43 y.o. female with no significant past medical history presents to the emergency department for right upper quadrant epigastric pain.  According to the patient in October of this year she had similar symptoms, states she was seen and was told it could either be gallstones or stomach ulcer.  Patient states her symptoms went away and she felt much better to the last couple days when her symptoms have returned.  Patient states the pain is much worse with any attempted oral intake food or drink causes her pain increased as well as nausea.  States some vomiting this morning.  No diarrhea.  No fever cough or shortness of breath.  States her abdominal pain is moderate to severe currently states is 10/10 more of a aching type pain in the upper mid abdomen wrapping around to the right side.   History reviewed. No pertinent past medical history.  There are no problems to display for this patient.   History reviewed. No pertinent surgical history.  Prior to Admission medications   Medication Sig Start Date End Date Taking? Authorizing Provider  dicyclomine (BENTYL) 10 MG capsule Take 1 capsule (10 mg total) by mouth 4 (four) times daily -  before meals and at bedtime. As needed for pain 07/07/19 10/05/19  Jonathon Bellows, MD  ferrous sulfate 325 (65 FE) MG EC tablet Take 1 tablet (325 mg total) by mouth 2 (two) times daily. 06/15/19 12/12/19  Jonathon Bellows, MD  pantoprazole (PROTONIX) 40 MG tablet Take 1 tablet (40 mg total) by mouth daily. 06/14/19   Jonathon Bellows, MD    No Known Allergies  History reviewed. No pertinent family history.  Social History Social History   Tobacco Use  . Smoking status: Never Smoker  . Smokeless tobacco: Never Used  Substance Use Topics  . Alcohol  use: Yes    Comment: occ  . Drug use: Never    Review of Systems Constitutional: Negative for fever. Cardiovascular: Negative for chest pain. Respiratory: Negative for shortness of breath. Gastrointestinal: Upper abdominal pain.  Nausea and vomiting. Musculoskeletal: Negative for musculoskeletal complaints Skin: Negative for skin complaints  Neurological: Negative for headache All other ROS negative  ____________________________________________   PHYSICAL EXAM:  VITAL SIGNS: ED Triage Vitals  Enc Vitals Group     BP 07/08/19 1115 (!) 83/66     Pulse Rate 07/08/19 1115 78     Resp 07/08/19 1115 18     Temp 07/08/19 1115 99.1 F (37.3 C)     Temp Source 07/08/19 1115 Oral     SpO2 07/08/19 1115 100 %     Weight 07/08/19 1116 180 lb (81.6 kg)     Height 07/08/19 1116 5\' 5"  (1.651 m)     Head Circumference --      Peak Flow --      Pain Score 07/08/19 1116 9     Pain Loc --      Pain Edu? --      Excl. in [place]? --    Constitutional: Alert and oriented. Well appearing and in no distress. Eyes: Normal exam ENT      Head: Normocephalic and atraumatic      Mouth/Throat: Mucous membranes are moist. Cardiovascular: Normal rate, regular rhythm.  Respiratory: Normal respiratory effort without tachypnea  nor retractions. Breath sounds are clear Gastrointestinal: Soft and nontender. No distention. Musculoskeletal: Nontender with normal range of motion in all extremities.  Neurologic:  Normal speech and language. No gross focal neurologic deficits Skin:  Skin is warm, dry and intact.  Psychiatric: Mood and affect are normal. Speech and behavior are normal.     INITIAL IMPRESSION / ASSESSMENT AND PLAN / ED COURSE  Pertinent labs & imaging results that were available during my care of the patient were reviewed by me and considered in my medical decision making (see chart for details).   Patient presents emergency department for upper abdominal pain over the past 24 to 48 hours  along with nausea and vomiting.  Patient's labs have resulted showing LFT elevation with a total bilirubin of 4.5.  This would be concerning for cholecystitis versus choledocholithiasis, lipase is pending but I suspect it will be elevated as well.  Patient blood pressure was initially hypotensive in triage 83/66 however the patient has received approximately 100 cc of fluid and her blood pressure is currently [id number] systolic.  We will continue with IV hydration, treat pain nausea and obtain a right upper quadrant ultrasound to further evaluate.  Patient agreeable to plan of care.  Patient's lipase is resulted at 2200, LFTs are elevated along with a T bili of 4.5.  Patient's ultrasound shows borderline cholecystitis with pericholecystic fluid.  Discussed the patient with Dr. Celine Ahr of surgery who recommends medical admission as this will likely need GI consultation and possible intervention.  I discussed the patient with Dr. Marius Ditch of GI medicine.  We will proceed with MRCP, I will cover with antibiotics while awaiting results.  We will swab for Covid as a precaution.  Mackenzee Gould was evaluated in Emergency Department on 07/08/2019 for the symptoms described in the history of present illness. She was evaluated in the context of the global COVID-19 pandemic, which necessitated consideration that the patient might be at risk for infection with the SARS-CoV-2 virus that causes COVID-19. Institutional protocols and algorithms that pertain to the evaluation of patients at risk for COVID-19 are in a state of rapid change based on information released by regulatory bodies including the CDC and federal and state organizations. These policies and algorithms were followed during the patient's care in the ED.  ____________________________________________   FINAL CLINICAL IMPRESSION(S) / ED DIAGNOSES  Abdominal pain Choledocholithiasis Gallbladder pancreatitis    Harvest Dark, MD 07/08/19 1406

## 2019-07-08 NOTE — Consult Note (Signed)
Reason for Consult: gallstone pancreatitis Referring Physician: Tempie Donning, MD (emergency medicine)  Ann Stevens is an 43 y.o. female.  HPI: She presented to the emergency department today with abdominal pain.  She says that the pain actually started on Sunday.  Over the course of this week, she has had increasing epigastric and right upper quadrant pain.  She has had nausea and vomiting.  She came to the emergency department because she was concerned that she was getting dehydrated.  She had an episode of similar abdominal pain back in October.  A right upper quadrant ultrasound performed at that time showed gallbladder sludge and cholelithiasis.  She was discharged without surgical follow-up, but has been followed by gastroenterology.  Based upon the December 16 telephone note, it appears that a HIDA scan was ordered, but was not completed.  On evaluation in the emergency department today, she was noted to have a total bilirubin of 4.5 with a lipase over 2000.  White blood cell count was normal, but a right upper quadrant ultrasound demonstrated borderline wall thickening with trace pericholecystic fluid.  No sonographic Percell Stevens sign was noted by the sonographer.  The bile ducts were not dilated.  General surgery has been consulted by Dr. Kerman Passey for evaluation and management of gallstone pancreatitis.  Ann Stevens states that she has never had pancreatitis before.  She has never had jaundice, acholic stools, or dark, tea-colored urine.  She denies any fevers or chills associated with the current episode.  Her main symptoms have been abdominal pain in the epigastric area wrapping around to the back, nausea, and vomiting.  She has just received some morphine and states that this is beginning to take effect.  Past Medical History:  Diagnosis Date  . Anemia, iron deficiency     Past Surgical History:  Procedure Laterality Date  . CESAREAN SECTION      History reviewed. No pertinent family  history.  Social History:  reports that she has never smoked. She has never used smokeless tobacco. She reports current alcohol use. She reports that she does not use drugs.  Allergies: No Known Allergies  No current facility-administered medications on file prior to encounter.   Current Outpatient Medications on File Prior to Encounter  Medication Sig Dispense Refill  . dicyclomine (BENTYL) 10 MG capsule Take 1 capsule (10 mg total) by mouth 4 (four) times daily -  before meals and at bedtime. As needed for pain 120 capsule 2  . ferrous sulfate 325 (65 FE) MG EC tablet Take 1 tablet (325 mg total) by mouth 2 (two) times daily. 180 tablet 1  . pantoprazole (PROTONIX) 40 MG tablet Take 1 tablet (40 mg total) by mouth daily. 90 tablet 3      Results for orders placed or performed during the hospital encounter of 07/08/19 (from the past 48 hour(s))  Lipase, blood     Status: Abnormal   Collection Time: 07/08/19 11:30 AM  Result Value Ref Range   Lipase 2,287 (H) 11 - 51 U/L    Comment: RESULT CONFIRMED BY MANUAL DILUTION.PMF Performed at [facility], [address] [place], [place] [address]   Comprehensive metabolic panel     Status: Abnormal   Collection Time: 07/08/19 11:30 AM  Result Value Ref Range   Sodium 138 135 - 145 mmol/L   Potassium 3.7 3.5 - 5.1 mmol/L   Chloride 102 98 - 111 mmol/L   CO2 22 22 - 32 mmol/L   Glucose, Bld 112 (H) 70 -  99 mg/dL   BUN 11 6 - 20 mg/dL   Creatinine, Ser 0.70 0.44 - 1.00 mg/dL   Calcium 9.2 8.9 - 10.3 mg/dL   Total Protein 8.5 (H) 6.5 - 8.1 g/dL   Albumin 4.0 3.5 - 5.0 g/dL   AST 97 (H) 15 - 41 U/L   ALT 154 (H) 0 - 44 U/L   Alkaline Phosphatase 131 (H) 38 - 126 U/L   Total Bilirubin 4.5 (H) 0.3 - 1.2 mg/dL   GFR calc non Af Amer >60 >60 mL/min   GFR calc Af Amer >60 >60 mL/min   Anion gap 14 5 - 15    Comment: Performed at [facility], [address] [place], [place] [address]  CBC     Status: Abnormal    Collection Time: 07/08/19 11:30 AM  Result Value Ref Range   WBC 9.9 4.0 - 10.5 K/uL   RBC 4.92 3.87 - 5.11 MIL/uL   Hemoglobin 12.2 12.0 - 15.0 g/dL   HCT 39.5 36.0 - 46.0 %   MCV 80.3 80.0 - 100.0 fL   MCH 24.8 (L) 26.0 - 34.0 pg   MCHC 30.9 30.0 - 36.0 g/dL   RDW 17.1 (H) 11.5 - 15.5 %   Platelets 354 150 - 400 K/uL   nRBC 0.0 0.0 - 0.2 %    Comment: Performed at [facility], [address] [place], [place] [address]    US Abdomen Limited RUQ  Result Date: 07/08/2019 CLINICAL DATA:  Upper abdominal pain with nausea and vomiting EXAM: ULTRASOUND ABDOMEN LIMITED RIGHT UPPER QUADRANT COMPARISON:  Ultrasound right upper quadrant April 26, 2019 FINDINGS: Gallbladder: Within the gallbladder, there are multiple echogenic foci which move and shadow consistent with cholelithiasis. Largest individual gallstone measures 7 mm in length. There is also sludge in the gallbladder. The gallbladder wall is borderline thickened with trace pericholecystic fluid. No sonographic Murphy sign noted by sonographer. Common bile duct: Diameter: 4 mm. No intrahepatic or extrahepatic biliary duct dilatation. Liver: No focal lesion identified. Within normal limits in parenchymal echogenicity. Portal vein is patent on color Doppler imaging with normal direction of blood flow towards the liver. Other: None. IMPRESSION: Cholelithiasis and sludge within the gallbladder. Gallbladder wall borderline thickened with trace pericholecystic fluid. These findings are concerning for a degree of acute cholecystitis. Study otherwise unremarkable. Electronically Signed   By: Lowella Grip III M.D.   On: 07/08/2019 13:22    Review of Systems  All other systems reviewed and are negative.  Blood pressure 133/73, pulse 69, temperature 99.1 F (37.3 C), temperature source Oral, resp. rate 16, height 5\' 5"  (1.651 m), weight 81.6 kg, SpO2 98 %.  Body mass index is 29.95 kg/m.  Physical Exam  Constitutional: She is  oriented to person, place, and time. She appears well-developed and well-nourished.  She is obese  HENT:  Head: Normocephalic and atraumatic.  Mouth and nose are covered with a mask secondary to COVID-19 precautions  Eyes: Right eye exhibits no discharge. Left eye exhibits no discharge.  Neck: No tracheal deviation present.  Cardiovascular: Normal rate and regular rhythm.  Respiratory: Effort normal. No stridor. No respiratory distress.  GI: Soft. There is abdominal tenderness. There is no rebound and no guarding.  She is tender in the mid epigastrium and right upper quadrant.  Murphy sign is negative.  Genitourinary:    Genitourinary Comments: Deferred   Musculoskeletal:        General: No tenderness or edema.  Neurological: She  is alert and oriented to person, place, and time.  Skin: Skin is warm and dry.  Psychiatric: She has a normal mood and affect. Her behavior is normal.    Assessment/Plan: This is a 43 year old woman with a 1 week history of abdominal pain, nausea, and vomiting.  Initial evaluation in the emergency department is most consistent with gallstone pancreatitis.  She does not appear to have biliary dilatation, however her total bilirubin is 4.5, suggesting some degree of biliary obstruction.  Her white blood cell count is normal.  The pericholecystic fluid seen on right upper quadrant ultrasound may be secondary to pancreatitis.  Recommendations:  --Obtain MRCP and consult gastroenterology for possible ERCP --Admit to medicine --NPO --IV hydration --IV antibiotics (enteric gram-negative coverage) --Monitor labs, including CBC, comprehensive metabolic panel, lipase --Patient will require cholecystectomy prior to discharge.  General surgery will continue to follow.   Fredirick Maudlin 07/08/2019, 2:56 PM

## 2019-07-08 NOTE — Consult Note (Signed)
Cephas Darby, MD [address]  [place]  [place], [place] [address]  Main: [phone]  Fax: [phone] Pager: [phone]   Consultation  Referring Provider:     No ref. provider found Primary Care Physician:  Burnard Hawthorne, FNP Primary Gastroenterologist:  Dr. Vicente Males       Reason for Consultation:   Choledocholithiasis and gallstone pancreatitis  Date of Admission:  07/08/2019 Date of Consultation:  07/08/2019         HPI:   Brealynn Contino is a 43 y.o. female with history of cholelithiasis, presented with 3 days history of worsening epigastric and right upper quadrant pain associated with nausea and vomiting.  Pain initially started on Monday, took Tylenol and temporarily subsided.  Since Tuesday evening, pain has gotten worse, unable to eat.  She tried Bentyl which did not help.  She denies fever, chills.  Therefore, came to ER.  In the ER, she is found to have normal CBC, lipase 2287, AST 97, ALT 154, alkaline phosphatase 131, total bilirubin 4.5.  She presented to ER in October with similar pain, labs during that time including CBC, CMP, lipase were normal, underwent right upper quadrant ultrasound revealed cholelithiasis and fatty liver. Repeat right upper quadrant ultrasound during this visit revealed cholelithiasis associated with thickened gallbladder wall along with pericholecystic fluid concerning for acute cholecystitis.  Normal caliber CBD.  The ER physician called me about elevated bilirubin and I suggested MRCP.  I also informed him that Dr. Allen Norris is not available this weekend to do ERCP if MRCP confirms choledocholithiasis.  MRI/MRCP revealed acute interstitial pancreatitis as well as mild to moderate intrahepatic biliary ductal dilation, sludge and/or small stones in the CBD and common hepatic duct.  CBD is 5 mm in size.    NSAIDs: None  Antiplts/Anticoagulants/Anti thrombotics: None  GI Procedures: None She denies smoking or alcohol use  Past  Medical History:  Diagnosis Date  . Anemia, iron deficiency     Past Surgical History:  Procedure Laterality Date  . CESAREAN SECTION      Prior to Admission medications   Medication Sig Start Date End Date Taking? Authorizing Provider  dicyclomine (BENTYL) 10 MG capsule Take 1 capsule (10 mg total) by mouth 4 (four) times daily -  before meals and at bedtime. As needed for pain 07/07/19 10/05/19 Yes Jonathon Bellows, MD  ferrous sulfate 325 (65 FE) MG EC tablet Take 1 tablet (325 mg total) by mouth 2 (two) times daily. 06/15/19 12/12/19 Yes Jonathon Bellows, MD  norgestimate-ethinyl estradiol (MILI) 0.25-35 MG-MCG tablet Take 1 tablet by mouth daily.   Yes [provider]  pantoprazole (PROTONIX) 40 MG tablet Take 1 tablet (40 mg total) by mouth daily. 06/14/19  Yes Jonathon Bellows, MD   No current facility-administered medications for this encounter.  Current Outpatient Medications:  .  dicyclomine (BENTYL) 10 MG capsule, Take 1 capsule (10 mg total) by mouth 4 (four) times daily -  before meals and at bedtime. As needed for pain, Disp: 120 capsule, Rfl: 2 .  ferrous sulfate 325 (65 FE) MG EC tablet, Take 1 tablet (325 mg total) by mouth 2 (two) times daily., Disp: 180 tablet, Rfl: 1 .  norgestimate-ethinyl estradiol (MILI) 0.25-35 MG-MCG tablet, Take 1 tablet by mouth daily., Disp: , Rfl:  .  pantoprazole (PROTONIX) 40 MG tablet, Take 1 tablet (40 mg total) by mouth daily., Disp: 90 tablet, Rfl: 3  History reviewed. No pertinent family history.   Social  History   Tobacco Use  . Smoking status: Never Smoker  . Smokeless tobacco: Never Used  Substance Use Topics  . Alcohol use: Yes    Comment: occ  . Drug use: Never    Allergies as of 07/08/2019  . (No Known Allergies)    Review of Systems:    All systems reviewed and negative except where noted in HPI.   Physical Exam:  Vital signs in last 24 hours: Temp:  [99.1 F (37.3 C)] 99.1 F (37.3 C) (12/18 1115) Pulse Rate:   [69-86] 76 (12/18 1600) Resp:  [16-18] 16 (12/18 1546) BP: (83-135)/(62-78) 116/67 (12/18 1600) SpO2:  [94 %-100 %] 94 % (12/18 1600) Weight:  [81.6 kg] 81.6 kg (12/18 1116)   General:   Pleasant, cooperative in NAD Head:  Normocephalic and atraumatic. Eyes:   No icterus.   Conjunctiva pink. PERRLA. Ears:  Normal auditory acuity. Neck:  Supple; no masses or thyroidomegaly Lungs: Respirations even and unlabored. Lungs clear to auscultation bilaterally.   No wheezes, crackles, or rhonchi.  Heart:  Regular rate and rhythm;  Without murmur, clicks, rubs or gallops Abdomen:  Soft, nondistended, moderate tenderness in the epigastric and right upper quadrant region. Normal bowel sounds. No appreciable masses or hepatomegaly.  No rebound or guarding.  Rectal:  Not performed. Msk:  Symmetrical without gross deformities.  Strength normal Extremities:  Without edema, cyanosis or clubbing. Neurologic:  Alert and oriented x3;  grossly normal neurologically. Skin:  Intact without significant lesions or rashes. Psych:  Alert and cooperative. Normal affect.  LAB RESULTS: CBC Latest Ref Rng & Units 07/08/2019 04/26/2019  WBC 4.0 - 10.5 K/uL 9.9 13.3(H)  Hemoglobin 12.0 - 15.0 g/dL 12.2 11.0(L)  Hematocrit 36.0 - 46.0 % 39.5 34.8(L)  Platelets 150 - 400 K/uL 354 430(H)    BMET BMP Latest Ref Rng & Units 07/08/2019 04/26/2019  Glucose 70 - 99 mg/dL 112(H) 147(H)  BUN 6 - 20 mg/dL 11 8  Creatinine 0.44 - 1.00 mg/dL 0.70 0.65  Sodium 135 - 145 mmol/L 138 139  Potassium 3.5 - 5.1 mmol/L 3.7 3.5  Chloride 98 - 111 mmol/L 102 103  CO2 22 - 32 mmol/L 22 25  Calcium 8.9 - 10.3 mg/dL 9.2 9.4    LFT Hepatic Function Latest Ref Rng & Units 07/08/2019 04/26/2019  Total Protein 6.5 - 8.1 g/dL 8.5(H) 8.3(H)  Albumin 3.5 - 5.0 g/dL 4.0 4.3  AST 15 - 41 U/L 97(H) 16  ALT 0 - 44 U/L 154(H) 14  Alk Phosphatase 38 - 126 U/L 131(H) 76  Total Bilirubin 0.3 - 1.2 mg/dL 4.5(H) 0.4     STUDIES: MR 3D Recon  At Scanner  Result Date: 07/08/2019 CLINICAL DATA:  Acute abdominal pain, generalized. EXAM: MRI ABDOMEN WITH CONTRAST (WITH MRCP) TECHNIQUE: Multiplanar multisequence MR imaging of the abdomen was performed following the administration of intravenous contrast. Heavily T2-weighted images of the biliary and pancreatic ducts were obtained, and three-dimensional MRCP images were rendered by post processing. CONTRAST:  7.73m GADAVIST GADOBUTROL 1 MMOL/ML IV SOLN COMPARISON:  Ultrasound abdomen 07/08/2019 FINDINGS: Lower chest: Incidental imaging of the lung bases is unremarkable. Limited assessment on MRI. Hepatobiliary: Numerous gallstones layering dependently in the gallbladder also mixed with sludge. No signs of focal hepatic lesion. Gallbladder and gallbladder neck or filled with sludge. The cystic duct appears to join the common bile duct in the proximal third of the extrahepatic biliary tree. Just above the confluence there is sludge layering dependently in the common  hepatic duct. There may also be some stones and or sludge layering dependently in the distal common bile duct. Minimal intrahepatic biliary ductal distension is present. The common bile duct measures 5 mm. Pancreas: Signs of acute interstitial pancreatitis. No focal pancreatic fluid, stranding throughout the anterior pararenal space. Pancreatic enhancement is maintained. The splenic vein is patent. Spleen:  Normal appearance of the spleen. Adrenals/Urinary Tract: Adrenal glands are normal. Renal contours are smooth. Stomach/Bowel: Perigastric and perienteric stranding in the setting of acute pancreatitis. No signs of acute bowel process. Vascular/Lymphatic: Patent abdominal vasculature. 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 Other:  None. Musculoskeletal: No suspicious bone lesions identified. IMPRESSION: 1. Signs of acute interstitial pancreatitis. 2. Numerous gallstones layering dependently in the gallbladder and gallbladder neck.  The cystic duct appears to join the common bile duct in the proximal third of the extrahepatic biliary tree. 3. Mild to moderate intrahepatic biliary ductal distension. Sludge and/or small stones in the common hepatic duct and common bile duct as described. Common bile duct is at 5 mm currently. 4. 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 Electronically Signed   By: Zetta Bills M.D.   On: 07/08/2019 16:08   MR ABDOMEN WITH MRCP W CONTRAST  Result Date: 07/08/2019 CLINICAL DATA:  Acute abdominal pain, generalized. EXAM: MRI ABDOMEN WITH CONTRAST (WITH MRCP) TECHNIQUE: Multiplanar multisequence MR imaging of the abdomen was performed following the administration of intravenous contrast. Heavily T2-weighted images of the biliary and pancreatic ducts were obtained, and three-dimensional MRCP images were rendered by post processing. CONTRAST:  7.29m GADAVIST GADOBUTROL 1 MMOL/ML IV SOLN COMPARISON:  Ultrasound abdomen 07/08/2019 FINDINGS: Lower chest: Incidental imaging of the lung bases is unremarkable. Limited assessment on MRI. Hepatobiliary: Numerous gallstones layering dependently in the gallbladder also mixed with sludge. No signs of focal hepatic lesion. Gallbladder and gallbladder neck or filled with sludge. The cystic duct appears to join the common bile duct in the proximal third of the extrahepatic biliary tree. Just above the confluence there is sludge layering dependently in the common hepatic duct. There may also be some stones and or sludge layering dependently in the distal common bile duct. Minimal intrahepatic biliary ductal distension is present. The common bile duct measures 5 mm. Pancreas: Signs of acute interstitial pancreatitis. No focal pancreatic fluid, stranding throughout the anterior pararenal space. Pancreatic enhancement is maintained. The splenic vein is patent. Spleen:  Normal appearance of the spleen. Adrenals/Urinary Tract: Adrenal glands are normal. Renal contours  are smooth. Stomach/Bowel: Perigastric and perienteric stranding in the setting of acute pancreatitis. No signs of acute bowel process. Vascular/Lymphatic: Patent abdominal vasculature. 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 Other:  None. Musculoskeletal: No suspicious bone lesions identified. IMPRESSION: 1. Signs of acute interstitial pancreatitis. 2. Numerous gallstones layering dependently in the gallbladder and gallbladder neck. The cystic duct appears to join the common bile duct in the proximal third of the extrahepatic biliary tree. 3. Mild to moderate intrahepatic biliary ductal distension. Sludge and/or small stones in the common hepatic duct and common bile duct as described. Common bile duct is at 5 mm currently. 4. 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 Electronically Signed   By: GZetta BillsM.D.   On: 07/08/2019 16:08   UK[place]Abdomen Limited RUQ  Result Date: 07/08/2019 CLINICAL DATA:  Upper abdominal pain with nausea and vomiting EXAM: ULTRASOUND ABDOMEN LIMITED RIGHT UPPER QUADRANT COMPARISON:  Ultrasound right upper quadrant April 26, 2019 FINDINGS: Gallbladder: Within the gallbladder, there  are multiple echogenic foci which move and shadow consistent with cholelithiasis. Largest individual gallstone measures 7 mm in length. There is also sludge in the gallbladder. The gallbladder wall is borderline thickened with trace pericholecystic fluid. No sonographic Murphy sign noted by sonographer. Common bile duct: Diameter: 4 mm. No intrahepatic or extrahepatic biliary duct dilatation. Liver: No focal lesion identified. Within normal limits in parenchymal echogenicity. Portal vein is patent on color Doppler imaging with normal direction of blood flow towards the liver. Other: None. IMPRESSION: Cholelithiasis and sludge within the gallbladder. Gallbladder wall borderline thickened with trace pericholecystic fluid. These findings are concerning for a degree of acute  cholecystitis. Study otherwise unremarkable. Electronically Signed   By: Lowella Grip III M.D.   On: 07/08/2019 13:22      Impression / Plan:   Nevelyn Mellott is a 43 y.o. female with no significant past medical history who is admitted with acute cholecystitis, gallstone pancreatitis and choledocholithiasis.  Patient does not have ascending cholangitis at this time  Acute gallstone pancreatitis: uncomplicated Recommend aggressive fluid resuscitation with lactate of Ringer 150 mL/h N.p.o. Okay to start clear liquids if pain subsides by tomorrow Monitor electrolytes closely and replete as needed Pain control and antiemetics as needed  Choledocholithiasis confirmed on MRCP Tentative plan to perform ERCP on Monday by Dr. Allen Norris Monitor LFTs daily and observe for signs and symptoms of ascending cholangitis  Acute calculus cholecystitis Started on Zosyn Cholecystectomy per surgery prior to discharge  Given that patient does not have ascending cholangitis and has gallstone pancreatitis, after discussing with patient, we agreed to admit the patient and monitor over the weekend with plan to perform ERCP on Monday. Patient preferred to get admitted at [facility] with an understanding that she will need to be transferred if she develops any signs or symptoms of ascending cholangitis.  Patient expressed understanding of the plan  Thank you for involving me in the care of this patient.  Dr. Vicente Males will assume care for the weekend   LOS: 0 days   Sherri Sear, MD  07/08/2019, 7:08 PM   Note: This dictation was prepared with Dragon dictation along with smaller phrase technology. Any transcriptional errors that result from this process are unintentional.

## 2019-07-08 NOTE — ED Notes (Signed)
Patient transported to Ultrasound 

## 2019-07-08 NOTE — ED Notes (Signed)
Pt assisted up to commode to void.  

## 2019-07-08 NOTE — ED Triage Notes (Signed)
Pt to ER with c/o abdominal pain for last several days with n/v.  States she feels dehydrated.  Pt states called her GI who sent a rx for dycyclomine with no relief.

## 2019-07-09 ENCOUNTER — Encounter: Payer: Self-pay | Admitting: Internal Medicine

## 2019-07-09 DIAGNOSIS — K859 Acute pancreatitis without necrosis or infection, unspecified: Secondary | ICD-10-CM

## 2019-07-09 LAB — CBC
HCT: 33.2 % — ABNORMAL LOW (ref 36.0–46.0)
Hemoglobin: 10.4 g/dL — ABNORMAL LOW (ref 12.0–15.0)
MCH: 25.2 pg — ABNORMAL LOW (ref 26.0–34.0)
MCHC: 31.3 g/dL (ref 30.0–36.0)
MCV: 80.4 fL (ref 80.0–100.0)
Platelets: 302 10*3/uL (ref 150–400)
RBC: 4.13 MIL/uL (ref 3.87–5.11)
RDW: 17.1 % — ABNORMAL HIGH (ref 11.5–15.5)
WBC: 10.1 10*3/uL (ref 4.0–10.5)
nRBC: 0 % (ref 0.0–0.2)

## 2019-07-09 LAB — COMPREHENSIVE METABOLIC PANEL
ALT: 104 U/L — ABNORMAL HIGH (ref 0–44)
AST: 50 U/L — ABNORMAL HIGH (ref 15–41)
Albumin: 3.2 g/dL — ABNORMAL LOW (ref 3.5–5.0)
Alkaline Phosphatase: 105 U/L (ref 38–126)
Anion gap: 10 (ref 5–15)
BUN: 7 mg/dL (ref 6–20)
CO2: 21 mmol/L — ABNORMAL LOW (ref 22–32)
Calcium: 8.4 mg/dL — ABNORMAL LOW (ref 8.9–10.3)
Chloride: 103 mmol/L (ref 98–111)
Creatinine, Ser: 0.6 mg/dL (ref 0.44–1.00)
GFR calc Af Amer: >60 mL/min (ref >60)
GFR calc non Af Amer: >60 mL/min (ref >60)
Glucose, Bld: 94 mg/dL (ref 70–99)
Potassium: 3.6 mmol/L (ref 3.5–5.1)
Sodium: 134 mmol/L — ABNORMAL LOW (ref 135–145)
Total Bilirubin: 1.8 mg/dL — ABNORMAL HIGH (ref 0.3–1.2)
Total Protein: 6.7 g/dL (ref 6.5–8.1)

## 2019-07-09 LAB — LIPASE, BLOOD: Lipase: 324 U/L — ABNORMAL HIGH (ref 11–51)

## 2019-07-09 MED ORDER — PIPERACILLIN-TAZOBACTAM 3.375 G IVPB
3.3750 g | Freq: Three times a day (TID) | INTRAVENOUS | Status: DC
  Administered 2019-07-09 – 2019-07-11 (×7): 3.375 g via INTRAVENOUS
  Filled 2019-07-09 (×8): qty 50

## 2019-07-09 MED ORDER — POTASSIUM CHLORIDE CRYS ER 20 MEQ PO TBCR: 40.0000 meq | EXTENDED_RELEASE_TABLET | Freq: Once | ORAL | Status: DC

## 2019-07-09 NOTE — ED Notes (Signed)
ED TO INPATIENT HANDOFF REPORT  ED Nurse Name and Phone #: Torrie Mayers, RN [phone]  S Name/Age/Gender Ann Stevens 43 y.o. female Room/Bed: ED37A/ED37A  Code Status   Code Status: Full Code  Home/SNF/Other Home Patient oriented to: self, place, time and situation Is this baseline? Yes   Triage Complete: Triage complete  Chief Complaint Pancreatitis, gallstone [K85.10]  Triage Note Pt to ER with c/o abdominal pain for last several days with n/v.  States she feels dehydrated.  Pt states called her GI who sent a rx for dycyclomine with no relief.      Allergies No Known Allergies  Level of Care/Admitting Diagnosis ED Disposition    ED Disposition Condition [facility] Area: [place] [[id number]]  Level of Care: Telemetry [5]  Covid Evaluation: Asymptomatic Screening Protocol (No Symptoms)  Diagnosis: Pancreatitis, gallstone [id number]  Admitting Physician: Elwyn Reach [[id number]]  Attending Physician: Elwyn Reach [[id number]]  Estimated length of stay: past midnight tomorrow  Certification:: I certify this patient will need inpatient services for at least 2 midnights       B Medical/Surgery History Past Medical History:  Diagnosis Date  . Anemia, iron deficiency    Past Surgical History:  Procedure Laterality Date  . CESAREAN SECTION       A IV Location/Drains/Wounds Patient Lines/Drains/Airways Status   Active Line/Drains/Airways    Name:   Placement date:   Placement time:   Site:   Days:   Peripheral IV 07/08/19 Left Antecubital   07/08/19    1125    Antecubital   1          Intake/Output Last 24 hours  Intake/Output Summary (Last 24 hours) at 07/09/2019 0128 Last data filed at 07/08/2019 2004 Gross per 24 hour  Intake 100 ml  Output --  Net 100 ml    Labs/Imaging Results for orders placed or performed during the hospital encounter of 07/08/19 (from the past 48 hour(s))  Lipase, blood     Status:  Abnormal   Collection Time: 07/08/19 11:30 AM  Result Value Ref Range   Lipase 2,287 (H) 11 - 51 U/L    Comment: RESULT CONFIRMED BY MANUAL DILUTION.PMF Performed at [facility], [address] [place], [place] [address]   Comprehensive metabolic panel     Status: Abnormal   Collection Time: 07/08/19 11:30 AM  Result Value Ref Range   Sodium 138 135 - 145 mmol/L   Potassium 3.7 3.5 - 5.1 mmol/L   Chloride 102 98 - 111 mmol/L   CO2 22 22 - 32 mmol/L   Glucose, Bld 112 (H) 70 - 99 mg/dL   BUN 11 6 - 20 mg/dL   Creatinine, Ser 0.70 0.44 - 1.00 mg/dL   Calcium 9.2 8.9 - 10.3 mg/dL   Total Protein 8.5 (H) 6.5 - 8.1 g/dL   Albumin 4.0 3.5 - 5.0 g/dL   AST 97 (H) 15 - 41 U/L   ALT 154 (H) 0 - 44 U/L   Alkaline Phosphatase 131 (H) 38 - 126 U/L   Total Bilirubin 4.5 (H) 0.3 - 1.2 mg/dL   GFR calc non Af Amer >60 >60 mL/min   GFR calc Af Amer >60 >60 mL/min   Anion gap 14 5 - 15    Comment: Performed at [facility], [address], [place], [place] [address]  CBC     Status: Abnormal   Collection Time: 07/08/19 11:30 AM  Result Value  Ref Range   WBC 9.9 4.0 - 10.5 K/uL   RBC 4.92 3.87 - 5.11 MIL/uL   Hemoglobin 12.2 12.0 - 15.0 g/dL   HCT 39.5 36.0 - 46.0 %   MCV 80.3 80.0 - 100.0 fL   MCH 24.8 (L) 26.0 - 34.0 pg   MCHC 30.9 30.0 - 36.0 g/dL   RDW 17.1 (H) 11.5 - 15.5 %   Platelets 354 150 - 400 K/uL   nRBC 0.0 0.0 - 0.2 %    Comment: Performed at [facility], [address] [name], [place] [address]  Respiratory Panel by RT PCR (Flu A&B, Covid) - Nasopharyngeal Swab     Status: None   Collection Time: 07/08/19  2:19 PM   Specimen: Nasopharyngeal Swab  Result Value Ref Range   SARS Coronavirus 2 by RT PCR NEGATIVE NEGATIVE    Comment: (NOTE) SARS-CoV-2 target nucleic acids are NOT DETECTED. The SARS-CoV-2 RNA is generally detectable in upper respiratoy specimens during the acute phase of infection. The lowest concentration of SARS-CoV-2  viral copies this assay can detect is 131 copies/mL. A negative result does not preclude SARS-Cov-2 infection and should not be used as the sole basis for treatment or other patient management decisions. A negative result may occur with  improper specimen collection/handling, submission of specimen other than nasopharyngeal swab, presence of viral mutation(s) within the areas targeted by this assay, and inadequate number of viral copies (<131 copies/mL). A negative result must be combined with clinical observations, patient history, and epidemiological information. The expected result is Negative. Fact Sheet for Patients:  PinkCheek.be Fact Sheet for Healthcare Providers:  GravelBags.it This test is not yet ap proved or cleared by the [place] FDA and  has been authorized for detection and/or diagnosis of SARS-CoV-2 by FDA under an Emergency Use Authorization (EUA). This EUA will remain  in effect (meaning this test can be used) for the duration of the COVID-19 declaration under Section 564(b)(1) of the Act, 21 U.S.C. section 360bbb-3(b)(1), unless the authorization is terminated or revoked sooner.    Influenza A by PCR NEGATIVE NEGATIVE   Influenza B by PCR NEGATIVE NEGATIVE    Comment: (NOTE) The Xpert Xpress SARS-CoV-2/FLU/RSV assay is intended as an aid in  the diagnosis of influenza from Nasopharyngeal swab specimens and  should not be used as a sole basis for treatment. Nasal washings and  aspirates are unacceptable for Xpert Xpress SARS-CoV-2/FLU/RSV  testing. Fact Sheet for Patients: PinkCheek.be Fact Sheet for Healthcare Providers: GravelBags.it This test is not yet approved or cleared by the [place] FDA and  has been authorized for detection and/or diagnosis of SARS-CoV-2 by  FDA under an Emergency Use Authorization (EUA). This EUA will remain  in  effect (meaning this test can be used) for the duration of the  Covid-19 declaration under Section 564(b)(1) of the Act, 21  U.S.C. section 360bbb-3(b)(1), unless the authorization is  terminated or revoked. Performed at [facility], [address] [place], [place] [address]   Urinalysis, Complete w Microscopic     Status: Abnormal   Collection Time: 07/08/19  5:48 PM  Result Value Ref Range   Color, Urine YELLOW (A) YELLOW   APPearance CLEAR (A) CLEAR   Specific Gravity, Urine 1.018 1.005 - 1.030   pH 5.0 5.0 - 8.0   Glucose, UA NEGATIVE NEGATIVE mg/dL   Hgb urine dipstick MODERATE (A) NEGATIVE   Bilirubin Urine NEGATIVE NEGATIVE   Ketones, ur 80 (A) NEGATIVE mg/dL  Protein, ur NEGATIVE NEGATIVE mg/dL   Nitrite NEGATIVE NEGATIVE   Leukocytes,Ua TRACE (A) NEGATIVE   RBC / HPF 0-5 0 - 5 RBC/hpf   WBC, UA 11-20 0 - 5 WBC/hpf   Bacteria, UA NONE SEEN NONE SEEN   Squamous Epithelial / LPF 0-5 0 - 5   Mucus PRESENT     Comment: Performed at [facility], [address], [place], [place] [address]  Pregnancy, urine POC     Status: None   Collection Time: 07/08/19  6:39 PM  Result Value Ref Range   Preg Test, Ur NEGATIVE NEGATIVE    Comment:        THE SENSITIVITY OF THIS METHODOLOGY IS >24 mIU/mL    MR 3D Recon At Scanner  Result Date: 07/08/2019 CLINICAL DATA:  Acute abdominal pain, generalized. EXAM: MRI ABDOMEN WITH CONTRAST (WITH MRCP) TECHNIQUE: Multiplanar multisequence MR imaging of the abdomen was performed following the administration of intravenous contrast. Heavily T2-weighted images of the biliary and pancreatic ducts were obtained, and three-dimensional MRCP images were rendered by post processing. CONTRAST:  7.33mL GADAVIST GADOBUTROL 1 MMOL/ML IV SOLN COMPARISON:  Ultrasound abdomen 07/08/2019 FINDINGS: Lower chest: Incidental imaging of the lung bases is unremarkable. Limited assessment on MRI. Hepatobiliary: Numerous gallstones layering  dependently in the gallbladder also mixed with sludge. No signs of focal hepatic lesion. Gallbladder and gallbladder neck or filled with sludge. The cystic duct appears to join the common bile duct in the proximal third of the extrahepatic biliary tree. Just above the confluence there is sludge layering dependently in the common hepatic duct. There may also be some stones and or sludge layering dependently in the distal common bile duct. Minimal intrahepatic biliary ductal distension is present. The common bile duct measures 5 mm. Pancreas: Signs of acute interstitial pancreatitis. No focal pancreatic fluid, stranding throughout the anterior pararenal space. Pancreatic enhancement is maintained. The splenic vein is patent. Spleen:  Normal appearance of the spleen. Adrenals/Urinary Tract: Adrenal glands are normal. Renal contours are smooth. Stomach/Bowel: Perigastric and perienteric stranding in the setting of acute pancreatitis. No signs of acute bowel process. Vascular/Lymphatic: Patent abdominal vasculature. 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 Other:  None. Musculoskeletal: No suspicious bone lesions identified. IMPRESSION: 1. Signs of acute interstitial pancreatitis. 2. Numerous gallstones layering dependently in the gallbladder and gallbladder neck. The cystic duct appears to join the common bile duct in the proximal third of the extrahepatic biliary tree. 3. Mild to moderate intrahepatic biliary ductal distension. Sludge and/or small stones in the common hepatic duct and common bile duct as described. Common bile duct is at 5 mm currently. 4. 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 Electronically Signed   By: Zetta Bills M.D.   On: 07/08/2019 16:08   MR ABDOMEN WITH MRCP W CONTRAST  Result Date: 07/08/2019 CLINICAL DATA:  Acute abdominal pain, generalized. EXAM: MRI ABDOMEN WITH CONTRAST (WITH MRCP) TECHNIQUE: Multiplanar multisequence MR imaging of the abdomen was  performed following the administration of intravenous contrast. Heavily T2-weighted images of the biliary and pancreatic ducts were obtained, and three-dimensional MRCP images were rendered by post processing. CONTRAST:  7.92mL GADAVIST GADOBUTROL 1 MMOL/ML IV SOLN COMPARISON:  Ultrasound abdomen 07/08/2019 FINDINGS: Lower chest: Incidental imaging of the lung bases is unremarkable. Limited assessment on MRI. Hepatobiliary: Numerous gallstones layering dependently in the gallbladder also mixed with sludge. No signs of focal hepatic lesion. Gallbladder and gallbladder neck or filled with sludge. The cystic duct appears to join  the common bile duct in the proximal third of the extrahepatic biliary tree. Just above the confluence there is sludge layering dependently in the common hepatic duct. There may also be some stones and or sludge layering dependently in the distal common bile duct. Minimal intrahepatic biliary ductal distension is present. The common bile duct measures 5 mm. Pancreas: Signs of acute interstitial pancreatitis. No focal pancreatic fluid, stranding throughout the anterior pararenal space. Pancreatic enhancement is maintained. The splenic vein is patent. Spleen:  Normal appearance of the spleen. Adrenals/Urinary Tract: Adrenal glands are normal. Renal contours are smooth. Stomach/Bowel: Perigastric and perienteric stranding in the setting of acute pancreatitis. No signs of acute bowel process. Vascular/Lymphatic: Patent abdominal vasculature. 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 Other:  None. Musculoskeletal: No suspicious bone lesions identified. IMPRESSION: 1. Signs of acute interstitial pancreatitis. 2. Numerous gallstones layering dependently in the gallbladder and gallbladder neck. The cystic duct appears to join the common bile duct in the proximal third of the extrahepatic biliary tree. 3. Mild to moderate intrahepatic biliary ductal distension. Sludge and/or small stones in  the common hepatic duct and common bile duct as described. Common bile duct is at 5 mm currently. 4. 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 Electronically Signed   By: Zetta Bills M.D.   On: 07/08/2019 16:08   US Abdomen Limited RUQ  Result Date: 07/08/2019 CLINICAL DATA:  Upper abdominal pain with nausea and vomiting EXAM: ULTRASOUND ABDOMEN LIMITED RIGHT UPPER QUADRANT COMPARISON:  Ultrasound right upper quadrant April 26, 2019 FINDINGS: Gallbladder: Within the gallbladder, there are multiple echogenic foci which move and shadow consistent with cholelithiasis. Largest individual gallstone measures 7 mm in length. There is also sludge in the gallbladder. The gallbladder wall is borderline thickened with trace pericholecystic fluid. No sonographic Murphy sign noted by sonographer. Common bile duct: Diameter: 4 mm. No intrahepatic or extrahepatic biliary duct dilatation. Liver: No focal lesion identified. Within normal limits in parenchymal echogenicity. Portal vein is patent on color Doppler imaging with normal direction of blood flow towards the liver. Other: None. IMPRESSION: Cholelithiasis and sludge within the gallbladder. Gallbladder wall borderline thickened with trace pericholecystic fluid. These findings are concerning for a degree of acute cholecystitis. Study otherwise unremarkable. Electronically Signed   By: Lowella Grip III M.D.   On: 07/08/2019 13:22    Pending Labs Unresulted Labs (From admission, onward)    Start     Ordered   07/15/19 0500  Creatinine, serum  (enoxaparin (LOVENOX)    CrCl >/= 30 ml/min)  Weekly,   STAT    Comments: while on enoxaparin therapy    07/08/19 1942   07/09/19 0500  Comprehensive metabolic panel  Tomorrow morning,   STAT     07/08/19 1942   07/09/19 0500  CBC  Tomorrow morning,   STAT     07/08/19 1942   07/09/19 0500  Lipase, blood  Daily,   STAT     07/08/19 2226   07/08/19 1943  HIV Antibody (routine testing w rflx)  (HIV  Antibody (Routine testing w reflex) panel)  Once,   STAT     07/08/19 1942   07/08/19 1943  CBC  (enoxaparin (LOVENOX)    CrCl >/= 30 ml/min)  Once,   STAT    Comments: Baseline for enoxaparin therapy IF NOT ALREADY DRAWN.  Notify MD if PLT < 100 K.    07/08/19 1942   07/08/19 1943  Creatinine, serum  (enoxaparin (LOVENOX)  CrCl >/= 30 ml/min)  Once,   STAT    Comments: Baseline for enoxaparin therapy IF NOT ALREADY DRAWN.    07/08/19 1942          Vitals/Pain Today's Vitals   07/08/19 2215 07/08/19 2230 07/08/19 2236 07/09/19 0100  BP:  100/83  128/68  Pulse: 65   72  Resp: 16 16  16   Temp:    98.5 F (36.9 C)  TempSrc:    Oral  SpO2: 93%   98%  Weight:      Height:      PainSc:   2      Isolation Precautions No active isolations  Medications Medications  enoxaparin (LOVENOX) injection 40 mg (40 mg Subcutaneous Given 07/08/19 2047)  lactated ringers infusion ( Intravenous New Bag/Given 07/08/19 2003)  ondansetron (ZOFRAN) tablet 4 mg (has no administration in time range)    Or  ondansetron (ZOFRAN) injection 4 mg (has no administration in time range)  HYDROmorphone (DILAUDID) injection 1 mg (1 mg Intravenous Given 07/08/19 2047)  piperacillin-tazobactam (ZOSYN) IVPB 3.375 g (has no administration in time range)  0.9 %  sodium chloride infusion ( Intravenous Stopped 07/08/19 1405)  morphine 4 MG/ML injection 4 mg (4 mg Intravenous Given 07/08/19 1226)  ondansetron (ZOFRAN) injection 4 mg (4 mg Intravenous Given 07/08/19 1226)  sodium chloride 0.9 % bolus 1,000 mL (0 mLs Intravenous Stopped 07/08/19 1753)  piperacillin-tazobactam (ZOSYN) IVPB 3.375 g (0 g Intravenous Stopped 07/08/19 1538)  morphine 4 MG/ML injection 4 mg (4 mg Intravenous Given 07/08/19 1415)  ondansetron (ZOFRAN) injection 4 mg (4 mg Intravenous Given 07/08/19 1415)  gadobutrol (GADAVIST) 1 MMOL/ML injection 7.5 mL (7.5 mLs Intravenous Contrast Given 07/08/19 1532)  fentaNYL (SUBLIMAZE) injection  100 mcg (100 mcg Intravenous Given 07/08/19 1750)  0.9 %  sodium chloride infusion ( Intravenous Stopped 07/08/19 2004)    Mobility walks Low fall risk   Focused Assessments Abdominal pain    R Recommendations: See Admitting Provider Note  Report given to:   Additional Notes:

## 2019-07-09 NOTE — ED Notes (Signed)
Report to silvia, rn. Pt assisted up to commode.

## 2019-07-09 NOTE — Progress Notes (Signed)
[place] [facility] [place] NAME: Ann Stevens    MR#:  [id number]  DATE OF BIRTH:  1975/11/17  SUBJECTIVE:    REVIEW OF SYSTEMS:   ROS Tolerating Diet: Tolerating PT:   DRUG ALLERGIES:  No Known Allergies  VITALS:  Blood pressure 118/61, pulse 68, temperature 99.1 F (37.3 C), temperature source Oral, resp. rate 18, height 5\' 5"  (1.651 m), weight 81.6 kg, SpO2 100 %.  PHYSICAL EXAMINATION:   Physical Exam  GENERAL:  43 y.o.-year-old patient lying in the bed with no acute distress.  EYES: Pupils equal, round, reactive to light and accommodation. No scleral icterus. Extraocular muscles intact.  HEENT: Head atraumatic, normocephalic. Oropharynx and nasopharynx clear.  NECK:  Supple, no jugular venous distention. No thyroid enlargement, no tenderness.  LUNGS: Normal breath sounds bilaterally, no wheezing, rales, rhonchi. No use of accessory muscles of respiration.  CARDIOVASCULAR: S1, S2 normal. No murmurs, rubs, or gallops.  ABDOMEN: Soft, nontender, nondistended. Bowel sounds present. No organomegaly or mass.  EXTREMITIES: No cyanosis, clubbing or edema b/l.    NEUROLOGIC: Cranial nerves II through XII are intact. No focal Motor or sensory deficits b/l.   PSYCHIATRIC:  patient is alert and oriented x 3.  SKIN: No obvious rash, lesion, or ulcer.   LABORATORY PANEL:  CBC Recent Labs  Lab 07/09/19 0416  WBC 10.1  HGB 10.4*  HCT 33.2*  PLT 302    Chemistries  Recent Labs  Lab 07/09/19 0416  NA 134*  K 3.6  CL 103  CO2 21*  GLUCOSE 94  BUN 7  CREATININE 0.60  CALCIUM 8.4*  AST 50*  ALT 104*  ALKPHOS 105  BILITOT 1.8*   Cardiac Enzymes No results for input(s): TROPONINI in the last 168 hours. RADIOLOGY:  MR 3D Recon At Scanner  Result Date: 07/08/2019 CLINICAL DATA:  Acute abdominal pain, generalized. EXAM: MRI ABDOMEN WITH CONTRAST (WITH MRCP) TECHNIQUE: Multiplanar multisequence MR imaging of the abdomen was  performed following the administration of intravenous contrast. Heavily T2-weighted images of the biliary and pancreatic ducts were obtained, and three-dimensional MRCP images were rendered by post processing. CONTRAST:  7.23mL GADAVIST GADOBUTROL 1 MMOL/ML IV SOLN COMPARISON:  Ultrasound abdomen 07/08/2019 FINDINGS: Lower chest: Incidental imaging of the lung bases is unremarkable. Limited assessment on MRI. Hepatobiliary: Numerous gallstones layering dependently in the gallbladder also mixed with sludge. No signs of focal hepatic lesion. Gallbladder and gallbladder neck or filled with sludge. The cystic duct appears to join the common bile duct in the proximal third of the extrahepatic biliary tree. Just above the confluence there is sludge layering dependently in the common hepatic duct. There may also be some stones and or sludge layering dependently in the distal common bile duct. Minimal intrahepatic biliary ductal distension is present. The common bile duct measures 5 mm. Pancreas: Signs of acute interstitial pancreatitis. No focal pancreatic fluid, stranding throughout the anterior pararenal space. Pancreatic enhancement is maintained. The splenic vein is patent. Spleen:  Normal appearance of the spleen. Adrenals/Urinary Tract: Adrenal glands are normal. Renal contours are smooth. Stomach/Bowel: Perigastric and perienteric stranding in the setting of acute pancreatitis. No signs of acute bowel process. Vascular/Lymphatic: Patent abdominal vasculature. 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 Other:  None. Musculoskeletal: No suspicious bone lesions identified. IMPRESSION: 1. Signs of acute interstitial pancreatitis. 2. Numerous gallstones layering dependently in the gallbladder and gallbladder neck. The cystic duct appears to join the common bile duct in the  proximal third of the extrahepatic biliary tree. 3. Mild to moderate intrahepatic biliary ductal distension. Sludge and/or small stones in  the common hepatic duct and common bile duct as described. Common bile duct is at 5 mm currently. 4. 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 Electronically Signed   By: Zetta Bills M.D.   On: 07/08/2019 16:08   MR ABDOMEN WITH MRCP W CONTRAST  Result Date: 07/08/2019 CLINICAL DATA:  Acute abdominal pain, generalized. EXAM: MRI ABDOMEN WITH CONTRAST (WITH MRCP) TECHNIQUE: Multiplanar multisequence MR imaging of the abdomen was performed following the administration of intravenous contrast. Heavily T2-weighted images of the biliary and pancreatic ducts were obtained, and three-dimensional MRCP images were rendered by post processing. CONTRAST:  7.76mL GADAVIST GADOBUTROL 1 MMOL/ML IV SOLN COMPARISON:  Ultrasound abdomen 07/08/2019 FINDINGS: Lower chest: Incidental imaging of the lung bases is unremarkable. Limited assessment on MRI. Hepatobiliary: Numerous gallstones layering dependently in the gallbladder also mixed with sludge. No signs of focal hepatic lesion. Gallbladder and gallbladder neck or filled with sludge. The cystic duct appears to join the common bile duct in the proximal third of the extrahepatic biliary tree. Just above the confluence there is sludge layering dependently in the common hepatic duct. There may also be some stones and or sludge layering dependently in the distal common bile duct. Minimal intrahepatic biliary ductal distension is present. The common bile duct measures 5 mm. Pancreas: Signs of acute interstitial pancreatitis. No focal pancreatic fluid, stranding throughout the anterior pararenal space. Pancreatic enhancement is maintained. The splenic vein is patent. Spleen:  Normal appearance of the spleen. Adrenals/Urinary Tract: Adrenal glands are normal. Renal contours are smooth. Stomach/Bowel: Perigastric and perienteric stranding in the setting of acute pancreatitis. No signs of acute bowel process. Vascular/Lymphatic: Patent abdominal vasculature. 8 mm splenic  artery aneurysm near the splenic hilum. Other:  None. Musculoskeletal: No suspicious bone lesions identified. IMPRESSION: 1. Signs of acute interstitial pancreatitis. 2. Numerous gallstones layering dependently in the gallbladder and gallbladder neck. The cystic duct appears to join the common bile duct in the proximal third of the extrahepatic biliary tree. 3. Mild to moderate intrahepatic biliary ductal distension. Sludge and/or small stones in the common hepatic duct and common bile duct as described. Common bile duct is at 5 mm currently. 4. 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 Electronically Signed   By: Zetta Bills M.D.   On: 07/08/2019 16:08   US Abdomen Limited RUQ  Result Date: 07/08/2019 CLINICAL DATA:  Upper abdominal pain with nausea and vomiting EXAM: ULTRASOUND ABDOMEN LIMITED RIGHT UPPER QUADRANT COMPARISON:  Ultrasound right upper quadrant April 26, 2019 FINDINGS: Gallbladder: Within the gallbladder, there are multiple echogenic foci which move and shadow consistent with cholelithiasis. Largest individual gallstone measures 7 mm in length. There is also sludge in the gallbladder. The gallbladder wall is borderline thickened with trace pericholecystic fluid. No sonographic Murphy sign noted by sonographer. Common bile duct: Diameter: 4 mm. No intrahepatic or extrahepatic biliary duct dilatation. Liver: No focal lesion identified. Within normal limits in parenchymal echogenicity. Portal vein is patent on color Doppler imaging with normal direction of blood flow towards the liver. Other: None. IMPRESSION: Cholelithiasis and sludge within the gallbladder. Gallbladder wall borderline thickened with trace pericholecystic fluid. These findings are concerning for a degree of acute cholecystitis. Study otherwise unremarkable. Electronically Signed   By: Lowella Grip III M.D.   On: 07/08/2019 13:22   ASSESSMENT AND PLAN:  Vaneisha Horky is a 43 y.o. female with medical  history significant of iron deficiency anemia who presents to the ER with sudden onset of right upper quadrant abdominal pain in the last 2 days.  She had similar symptoms 2 months ago but resolved spontaneously.  But this time around pain was as high as 9 out of 10  #1 acute gallstone pancreatitis:  -  IV fluids with Lactate Ringers -IV and po prn pain management  -Prn Zofran for nausea vomiting -empiric IV Zosyn -lipase trending down 2287-- 324 -bilirubin and LFTs trending down -patient overall feels better with abdominal pain -MRCP confirmed gallstones, distal CBD stone/sludge  #2 acute cholecystitis:  -Secondary to gallstones.  -  seen by surgery plans for cholecystectomy tomorrow and if intraop cholangiogram is positive for stone in the CBD ERCP on Monday -appreciate GI and surgery input  #3 iron deficiency anemia: Continue monitoring H&H.   DVT prophylaxis: Lovenox Code Status: Full code Family Communication: No family at bedside Disposition Plan: Home Consults called: Dr. Marius Ditch, gastroenterology and Surgery  Admission status: Inpatient   TOTAL TIME TAKING CARE OF THIS PATIENT: *30* minutes.  >50% time spent on counselling and coordination of care  POSSIBLE D/C IN *1-3* DAYS, DEPENDING ON CLINICAL CONDITION.  Note: This dictation was prepared with Dragon dictation along with smaller phrase technology. Any transcriptional errors that result from this process are unintentional.  Fritzi Mandes M.D on 07/09/2019 at 1:58 PM  Between 7am to 6pm - Pager - [phone]  After 6pm go to [email or web address]  [facility]   CC: Primary care physician; Burnard Hawthorne, FNPPatient ID: Ann Stevens, female   DOB: [birth year], 43 y.o.   MRN: [id number]

## 2019-07-09 NOTE — Consult Note (Signed)
General Surgery Progress Note  Interval history:  This morning, [name] states that her abdominal pain is improving.  She is no longer experiencing nausea.  No fevers or chills.  Her white blood cell count remains normal.  Her total bilirubin and lipase are decreasing.  Remains n.p.o., with IV fluids and IV antibiotics.  Ann Stevens is an 43 y.o. female.  HPI: She presented to the emergency department today with abdominal pain.  She says that the pain actually started on Sunday.  Over the course of this week, she has had increasing epigastric and right upper quadrant pain.  She has had nausea and vomiting.  She came to the emergency department because she was concerned that she was getting dehydrated.  She had an episode of similar abdominal pain back in October.  A right upper quadrant ultrasound performed at that time showed gallbladder sludge and cholelithiasis.  She was discharged without surgical follow-up, but has been followed by gastroenterology.  Based upon the December 16 telephone note, it appears that a HIDA scan was ordered, but was not completed.  On evaluation in the emergency department today, she was noted to have a total bilirubin of 4.5 with a lipase over 2000.  White blood cell count was normal, but a right upper quadrant ultrasound demonstrated borderline wall thickening with trace pericholecystic fluid.  No sonographic Percell Miller sign was noted by the sonographer.  The bile ducts were not dilated.  General surgery has been consulted by Dr. Kerman Passey for evaluation and management of gallstone pancreatitis.  [name] states that she has never had pancreatitis before.  She has never had jaundice, acholic stools, or dark, tea-colored urine.  She denies any fevers or chills associated with the current episode.  Her main symptoms have been abdominal pain in the epigastric area wrapping around to the back, nausea, and vomiting.  She has just received some morphine and states that  this is beginning to take effect.  Past Medical History:  Diagnosis Date  . Anemia, iron deficiency     Past Surgical History:  Procedure Laterality Date  . CESAREAN SECTION      History reviewed. No pertinent family history.  Social History:  reports that she has never smoked. She has never used smokeless tobacco. She reports current alcohol use. She reports that she does not use drugs.  Allergies: No Known Allergies  No current facility-administered medications on file prior to encounter.   Current Outpatient Medications on File Prior to Encounter  Medication Sig Dispense Refill  . dicyclomine (BENTYL) 10 MG capsule Take 1 capsule (10 mg total) by mouth 4 (four) times daily -  before meals and at bedtime. As needed for pain 120 capsule 2  . ferrous sulfate 325 (65 FE) MG EC tablet Take 1 tablet (325 mg total) by mouth 2 (two) times daily. 180 tablet 1  . norgestimate-ethinyl estradiol (MILI) 0.25-35 MG-MCG tablet Take 1 tablet by mouth daily.    . pantoprazole (PROTONIX) 40 MG tablet Take 1 tablet (40 mg total) by mouth daily. 90 tablet 3     Results for Ann Stevens (MRN [id number]) as of 07/09/2019 10:53  Ref. Range 07/09/2019 04:16  Sodium Latest Ref Range: 135 - 145 mmol/L 134 (L)  Potassium Latest Ref Range: 3.5 - 5.1 mmol/L 3.6  Chloride Latest Ref Range: 98 - 111 mmol/L 103  CO2 Latest Ref Range: 22 - 32 mmol/L 21 (L)  Glucose Latest Ref Range: 70 - 99 mg/dL 94  BUN Latest Ref  Range: 6 - 20 mg/dL 7  Creatinine Latest Ref Range: 0.44 - 1.00 mg/dL 0.60  Calcium Latest Ref Range: 8.9 - 10.3 mg/dL 8.4 (L)  Anion gap Latest Ref Range: 5 - 15  10  Alkaline Phosphatase Latest Ref Range: 38 - 126 U/L 105  Albumin Latest Ref Range: 3.5 - 5.0 g/dL 3.2 (L)  Lipase Latest Ref Range: 11 - 51 U/L 324 (H)  AST Latest Ref Range: 15 - 41 U/L 50 (H)  ALT Latest Ref Range: 0 - 44 U/L 104 (H)  Total Protein Latest Ref Range: 6.5 - 8.1 g/dL 6.7  Total Bilirubin Latest Ref Range:  0.3 - 1.2 mg/dL 1.8 (H)  GFR, Est Non African American Latest Ref Range: >60 mL/min >60  GFR, Est African American Latest Ref Range: >60 mL/min >60  WBC Latest Ref Range: 4.0 - 10.5 K/uL 10.1  RBC Latest Ref Range: 3.87 - 5.11 MIL/uL 4.13  Hemoglobin Latest Ref Range: 12.0 - 15.0 g/dL 10.4 (L)  HCT Latest Ref Range: 36.0 - 46.0 % 33.2 (L)  MCV Latest Ref Range: 80.0 - 100.0 fL 80.4  MCH Latest Ref Range: 26.0 - 34.0 pg 25.2 (L)  MCHC Latest Ref Range: 30.0 - 36.0 g/dL 31.3  RDW Latest Ref Range: 11.5 - 15.5 % 17.1 (H)  Platelets Latest Ref Range: 150 - 400 K/uL 302  nRBC Latest Ref Range: 0.0 - 0.2 % 0.0    Results for orders placed or performed during the hospital encounter of 07/08/19 (from the past 48 hour(s))  Lipase, blood     Status: Abnormal   Collection Time: 07/08/19 11:30 AM  Result Value Ref Range   Lipase 2,287 (H) 11 - 51 U/L    Comment: RESULT CONFIRMED BY MANUAL DILUTION.PMF Performed at [facility], [address] [place], [place] [address]   Comprehensive metabolic panel     Status: Abnormal   Collection Time: 07/08/19 11:30 AM  Result Value Ref Range   Sodium 138 135 - 145 mmol/L   Potassium 3.7 3.5 - 5.1 mmol/L   Chloride 102 98 - 111 mmol/L   CO2 22 22 - 32 mmol/L   Glucose, Bld 112 (H) 70 - 99 mg/dL   BUN 11 6 - 20 mg/dL   Creatinine, Ser 0.70 0.44 - 1.00 mg/dL   Calcium 9.2 8.9 - 10.3 mg/dL   Total Protein 8.5 (H) 6.5 - 8.1 g/dL   Albumin 4.0 3.5 - 5.0 g/dL   AST 97 (H) 15 - 41 U/L   ALT 154 (H) 0 - 44 U/L   Alkaline Phosphatase 131 (H) 38 - 126 U/L   Total Bilirubin 4.5 (H) 0.3 - 1.2 mg/dL   GFR calc non Af Amer >60 >60 mL/min   GFR calc Af Amer >60 >60 mL/min   Anion gap 14 5 - 15    Comment: Performed at [facility], [address] [place], [place] [address]  CBC     Status: Abnormal   Collection Time: 07/08/19 11:30 AM  Result Value Ref Range   WBC 9.9 4.0 - 10.5 K/uL   RBC 4.92 3.87 - 5.11 MIL/uL   Hemoglobin 12.2  12.0 - 15.0 g/dL   HCT 39.5 36.0 - 46.0 %   MCV 80.3 80.0 - 100.0 fL   MCH 24.8 (L) 26.0 - 34.0 pg   MCHC 30.9 30.0 - 36.0 g/dL   RDW 17.1 (H) 11.5 - 15.5 %   Platelets 354 150 - 400 K/uL  nRBC 0.0 0.0 - 0.2 %    Comment: Performed at [facility], [address] [place], [place] [address]  Respiratory Panel by RT PCR (Flu A&B, Covid) - Nasopharyngeal Swab     Status: None   Collection Time: 07/08/19  2:19 PM   Specimen: Nasopharyngeal Swab  Result Value Ref Range   SARS Coronavirus 2 by RT PCR NEGATIVE NEGATIVE    Comment: (NOTE) SARS-CoV-2 target nucleic acids are NOT DETECTED. The SARS-CoV-2 RNA is generally detectable in upper respiratoy specimens during the acute phase of infection. The lowest concentration of SARS-CoV-2 viral copies this assay can detect is 131 copies/mL. A negative result does not preclude SARS-Cov-2 infection and should not be used as the sole basis for treatment or other patient management decisions. A negative result may occur with  improper specimen collection/handling, submission of specimen other than nasopharyngeal swab, presence of viral mutation(s) within the areas targeted by this assay, and inadequate number of viral copies (<131 copies/mL). A negative result must be combined with clinical observations, patient history, and epidemiological information. The expected result is Negative. Fact Sheet for Patients:  PinkCheek.be Fact Sheet for Healthcare Providers:  GravelBags.it This test is not yet ap proved or cleared by the [place] FDA and  has been authorized for detection and/or diagnosis of SARS-CoV-2 by FDA under an Emergency Use Authorization (EUA). This EUA will remain  in effect (meaning this test can be used) for the duration of the COVID-19 declaration under Section 564(b)(1) of the Act, 21 U.S.C. section 360bbb-3(b)(1), unless the authorization is terminated  or revoked sooner.    Influenza A by PCR NEGATIVE NEGATIVE   Influenza B by PCR NEGATIVE NEGATIVE    Comment: (NOTE) The Xpert Xpress SARS-CoV-2/FLU/RSV assay is intended as an aid in  the diagnosis of influenza from Nasopharyngeal swab specimens and  should not be used as a sole basis for treatment. Nasal washings and  aspirates are unacceptable for Xpert Xpress SARS-CoV-2/FLU/RSV  testing. Fact Sheet for Patients: PinkCheek.be Fact Sheet for Healthcare Providers: GravelBags.it This test is not yet approved or cleared by the [place] FDA and  has been authorized for detection and/or diagnosis of SARS-CoV-2 by  FDA under an Emergency Use Authorization (EUA). This EUA will remain  in effect (meaning this test can be used) for the duration of the  Covid-19 declaration under Section 564(b)(1) of the Act, 21  U.S.C. section 360bbb-3(b)(1), unless the authorization is  terminated or revoked. Performed at [facility], [address] [place], [place] [address]   Urinalysis, Complete w Microscopic     Status: Abnormal   Collection Time: 07/08/19  5:48 PM  Result Value Ref Range   Color, Urine YELLOW (A) YELLOW   APPearance CLEAR (A) CLEAR   Specific Gravity, Urine 1.018 1.005 - 1.030   pH 5.0 5.0 - 8.0   Glucose, UA NEGATIVE NEGATIVE mg/dL   Hgb urine dipstick MODERATE (A) NEGATIVE   Bilirubin Urine NEGATIVE NEGATIVE   Ketones, ur 80 (A) NEGATIVE mg/dL   Protein, ur NEGATIVE NEGATIVE mg/dL   Nitrite NEGATIVE NEGATIVE   Leukocytes,Ua TRACE (A) NEGATIVE   RBC / HPF 0-5 0 - 5 RBC/hpf   WBC, UA 11-20 0 - 5 WBC/hpf   Bacteria, UA NONE SEEN NONE SEEN   Squamous Epithelial / LPF 0-5 0 - 5   Mucus PRESENT     Comment: Performed at [facility], [address] [place], [place] [address]  Pregnancy, urine POC  Status: None   Collection Time: 07/08/19  6:39 PM  Result Value Ref Range   Preg Test, Ur  NEGATIVE NEGATIVE    Comment:        THE SENSITIVITY OF THIS METHODOLOGY IS >24 mIU/mL   Comprehensive metabolic panel     Status: Abnormal   Collection Time: 07/09/19  4:16 AM  Result Value Ref Range   Sodium 134 (L) 135 - 145 mmol/L   Potassium 3.6 3.5 - 5.1 mmol/L   Chloride 103 98 - 111 mmol/L   CO2 21 (L) 22 - 32 mmol/L   Glucose, Bld 94 70 - 99 mg/dL   BUN 7 6 - 20 mg/dL   Creatinine, Ser 0.60 0.44 - 1.00 mg/dL   Calcium 8.4 (L) 8.9 - 10.3 mg/dL   Total Protein 6.7 6.5 - 8.1 g/dL   Albumin 3.2 (L) 3.5 - 5.0 g/dL   AST 50 (H) 15 - 41 U/L   ALT 104 (H) 0 - 44 U/L   Alkaline Phosphatase 105 38 - 126 U/L   Total Bilirubin 1.8 (H) 0.3 - 1.2 mg/dL   GFR calc non Af Amer >60 >60 mL/min   GFR calc Af Amer >60 >60 mL/min   Anion gap 10 5 - 15    Comment: Performed at [facility], [address] [place], [place] [address]  CBC     Status: Abnormal   Collection Time: 07/09/19  4:16 AM  Result Value Ref Range   WBC 10.1 4.0 - 10.5 K/uL   RBC 4.13 3.87 - 5.11 MIL/uL   Hemoglobin 10.4 (L) 12.0 - 15.0 g/dL   HCT 33.2 (L) 36.0 - 46.0 %   MCV 80.4 80.0 - 100.0 fL   MCH 25.2 (L) 26.0 - 34.0 pg   MCHC 31.3 30.0 - 36.0 g/dL   RDW 17.1 (H) 11.5 - 15.5 %   Platelets 302 150 - 400 K/uL   nRBC 0.0 0.0 - 0.2 %    Comment: Performed at [facility], [address] [place],  [address]  Lipase, blood     Status: Abnormal   Collection Time: 07/09/19  4:16 AM  Result Value Ref Range   Lipase 324 (H) 11 - 51 U/L    Comment: Performed at [facility], [place],  [address]    MR 3D Recon At Scanner  Result Date: 07/08/2019 CLINICAL DATA:  Acute abdominal pain, generalized. EXAM: MRI ABDOMEN WITH CONTRAST (WITH MRCP) TECHNIQUE: Multiplanar multisequence MR imaging of the abdomen was performed following the administration of intravenous contrast. Heavily T2-weighted images of the biliary and pancreatic ducts were obtained, and  three-dimensional MRCP images were rendered by post processing. CONTRAST:  7.13mL GADAVIST GADOBUTROL 1 MMOL/ML IV SOLN COMPARISON:  Ultrasound abdomen 07/08/2019 FINDINGS: Lower chest: Incidental imaging of the lung bases is unremarkable. Limited assessment on MRI. Hepatobiliary: Numerous gallstones layering dependently in the gallbladder also mixed with sludge. No signs of focal hepatic lesion. Gallbladder and gallbladder neck or filled with sludge. The cystic duct appears to join the common bile duct in the proximal third of the extrahepatic biliary tree. Just above the confluence there is sludge layering dependently in the common hepatic duct. There may also be some stones and or sludge layering dependently in the distal common bile duct. Minimal intrahepatic biliary ductal distension is present. The common bile duct measures 5 mm. Pancreas: Signs of acute interstitial pancreatitis. No focal pancreatic fluid, stranding throughout the anterior pararenal space. Pancreatic  enhancement is maintained. The splenic vein is patent. Spleen:  Normal appearance of the spleen. Adrenals/Urinary Tract: Adrenal glands are normal. Renal contours are smooth. Stomach/Bowel: Perigastric and perienteric stranding in the setting of acute pancreatitis. No signs of acute bowel process. Vascular/Lymphatic: Patent abdominal vasculature. 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 Other:  None. Musculoskeletal: No suspicious bone lesions identified. IMPRESSION: 1. Signs of acute interstitial pancreatitis. 2. Numerous gallstones layering dependently in the gallbladder and gallbladder neck. The cystic duct appears to join the common bile duct in the proximal third of the extrahepatic biliary tree. 3. Mild to moderate intrahepatic biliary ductal distension. Sludge and/or small stones in the common hepatic duct and common bile duct as described. Common bile duct is at 5 mm currently. 4. Small splenic artery aneurysm, attention on  follow-up. Electronically Signed   By: Zetta Bills M.D.   On: 07/08/2019 16:08   MR ABDOMEN WITH MRCP W CONTRAST  Result Date: 07/08/2019 CLINICAL DATA:  Acute abdominal pain, generalized. EXAM: MRI ABDOMEN WITH CONTRAST (WITH MRCP) TECHNIQUE: Multiplanar multisequence MR imaging of the abdomen was performed following the administration of intravenous contrast. Heavily T2-weighted images of the biliary and pancreatic ducts were obtained, and three-dimensional MRCP images were rendered by post processing. CONTRAST:  7.51mL GADAVIST GADOBUTROL 1 MMOL/ML IV SOLN COMPARISON:  Ultrasound abdomen 07/08/2019 FINDINGS: Lower chest: Incidental imaging of the lung bases is unremarkable. Limited assessment on MRI. Hepatobiliary: Numerous gallstones layering dependently in the gallbladder also mixed with sludge. No signs of focal hepatic lesion. Gallbladder and gallbladder neck or filled with sludge. The cystic duct appears to join the common bile duct in the proximal third of the extrahepatic biliary tree. Just above the confluence there is sludge layering dependently in the common hepatic duct. There may also be some stones and or sludge layering dependently in the distal common bile duct. Minimal intrahepatic biliary ductal distension is present. The common bile duct measures 5 mm. Pancreas: Signs of acute interstitial pancreatitis. No focal pancreatic fluid, stranding throughout the anterior pararenal space. Pancreatic enhancement is maintained. The splenic vein is patent. Spleen:  Normal appearance of the spleen. Adrenals/Urinary Tract: Adrenal glands are normal. Renal contours are smooth. Stomach/Bowel: Perigastric and perienteric stranding in the setting of acute pancreatitis. No signs of acute bowel process. Vascular/Lymphatic: Patent abdominal vasculature. 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 Other:  None. Musculoskeletal: No suspicious bone lesions identified. IMPRESSION: 1. Signs of acute  interstitial pancreatitis. 2. Numerous gallstones layering dependently in the gallbladder and gallbladder neck. The cystic duct appears to join the common bile duct in the proximal third of the extrahepatic biliary tree. 3. Mild to moderate intrahepatic biliary ductal distension. Sludge and/or small stones in the common hepatic duct and common bile duct as described. Common bile duct is at 5 mm currently. 4. 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 Electronically Signed   By: Zetta Bills M.D.   On: 07/08/2019 16:08   US Abdomen Limited RUQ  Result Date: 07/08/2019 CLINICAL DATA:  Upper abdominal pain with nausea and vomiting EXAM: ULTRASOUND ABDOMEN LIMITED RIGHT UPPER QUADRANT COMPARISON:  Ultrasound right upper quadrant April 26, 2019 FINDINGS: Gallbladder: Within the gallbladder, there are multiple echogenic foci which move and shadow consistent with cholelithiasis. Largest individual gallstone measures 7 mm in length. There is also sludge in the gallbladder. The gallbladder wall is borderline thickened with trace pericholecystic fluid. No sonographic Murphy sign noted by sonographer. Common bile duct: Diameter: 4 mm. No  intrahepatic or extrahepatic biliary duct dilatation. Liver: No focal lesion identified. Within normal limits in parenchymal echogenicity. Portal vein is patent on color Doppler imaging with normal direction of blood flow towards the liver. Other: None. IMPRESSION: Cholelithiasis and sludge within the gallbladder. Gallbladder wall borderline thickened with trace pericholecystic fluid. These findings are concerning for a degree of acute cholecystitis. Study otherwise unremarkable. Electronically Signed   By: Lowella Grip III M.D.   On: 07/08/2019 13:22    Review of Systems  All other systems reviewed and are negative.  Blood pressure 118/61, pulse 68, temperature 99.1 F (37.3 C), temperature source Oral, resp. rate 18, height 5\' 5"  (1.651 m), weight 81.6 kg,  SpO2 100 %.  Body mass index is 29.95 kg/m.  Physical Exam  Constitutional: She is oriented to person, place, and time. She appears well-developed and well-nourished.  She is obese  HENT:  Head: Normocephalic and atraumatic.  Mouth and nose are covered with a mask secondary to COVID-19 precautions  Eyes: Right eye exhibits no discharge. Left eye exhibits no discharge.  Neck: No tracheal deviation present.  Cardiovascular: Normal rate and regular rhythm.  Respiratory: Effort normal. No stridor. No respiratory distress.  GI: Soft. There is abdominal tenderness. There is no rebound and no guarding.  She is tender in the mid epigastrium and right upper quadrant.  Murphy sign is negative.  Exam improved from yesterday.  Genitourinary:    Genitourinary Comments: Deferred   Musculoskeletal:        General: No tenderness or edema.  Neurological: She is alert and oriented to person, place, and time.  Skin: Skin is warm and dry.  Psychiatric: She has a normal mood and affect. Her behavior is normal.    Assessment/Plan: This is a 43 year old woman with a 1 week history of abdominal pain, nausea, and vomiting.  Initial evaluation in the emergency department is most consistent with gallstone pancreatitis.  She does not appear to have biliary dilatation, however her total bilirubin is 4.5, suggesting some degree of biliary obstruction.  Her white blood cell count is normal.  The pericholecystic fluid seen on right upper quadrant ultrasound may be secondary to pancreatitis.  MRCP showed sludge and small stones within the common bile duct, as well as the common hepatic duct.  ERCP is not available this weekend.  Patient does not have ascending cholangitis so it is not necessary to transfer her to another institution for this procedure.  As her labs are improving, she may have passed what ever was obstructing the bile ducts.  She may not require ERCP, ultimately.  She will require cholecystectomy prior to  discharge.  Recommendations:  --Continue NPO; if sips of clears are offered, patient should be made n.p.o. after midnight in anticipation of possible cholecystectomy tomorrow --Continue IV hydration --Continue IV antibiotics (enteric gram-negative coverage) --Continue to onitor labs, including CBC, comprehensive metabolic panel, lipase --Patient will require cholecystectomy prior to discharge.  General surgery will continue to follow.   Ann Stevens 07/09/2019, 10:44 AM

## 2019-07-09 NOTE — Progress Notes (Signed)
Jonathon Bellows , MD [address] [place], [place], [place], [address] [address], [place], [place], [place], [address] Phone: [phone]  Fax: [phone]   Ann Stevens is being followed for gallstone pancreatitis, choledocholithiasis, acute cholecystitis day 1 of follow up   Subjective: Still has some pain but feeling better since yesterday.  Does not feel ready to eat or drink at this time.  Objective: Vital signs in last 24 hours: Vitals:   07/08/19 2230 07/09/19 0100 07/09/19 0252 07/09/19 0619  BP: 100/83 128/68 123/62 118/61  Pulse:  72 66 68  Resp: 16 16 16 18   Temp:  98.5 F (36.9 C) 99.3 F (37.4 C) 99.1 F (37.3 C)  TempSrc:  Oral Oral Oral  SpO2:  98% 98% 100%  Weight:      Height:       Weight change:   Intake/Output Summary (Last 24 hours) at 07/09/2019 1019 Last data filed at 07/09/2019 0325 Gross per 24 hour  Intake 137.22 ml  Output --  Net 137.22 ml     Exam: Heart:: Regular rate and rhythm, S1S2 present or without murmur or extra heart sounds Lungs: normal, clear to auscultation and clear to auscultation and percussion Abdomen: soft, nontender, normal bowel sounds   Lab Results: @LABTEST2 @ Micro Results: Recent Results (from the past 240 hour(s))  Respiratory Panel by RT PCR (Flu A&B, Covid) - Nasopharyngeal Swab     Status: None   Collection Time: 07/08/19  2:19 PM   Specimen: Nasopharyngeal Swab  Result Value Ref Range Status   SARS Coronavirus 2 by RT PCR NEGATIVE NEGATIVE Final    Comment: (NOTE) SARS-CoV-2 target nucleic acids are NOT DETECTED. The SARS-CoV-2 RNA is generally detectable in upper respiratoy specimens during the acute phase of infection. The lowest concentration of SARS-CoV-2 viral copies this assay can detect is 131 copies/mL. A negative result does not preclude SARS-Cov-2 infection and should not be used as the sole basis for treatment or other patient management decisions. A negative result may occur  with  improper specimen collection/handling, submission of specimen other than nasopharyngeal swab, presence of viral mutation(s) within the areas targeted by this assay, and inadequate number of viral copies (<131 copies/mL). A negative result must be combined with clinical observations, patient history, and epidemiological information. The expected result is Negative. Fact Sheet for Patients:  PinkCheek.be Fact Sheet for Healthcare Providers:  GravelBags.it This test is not yet ap proved or cleared by the [place] FDA and  has been authorized for detection and/or diagnosis of SARS-CoV-2 by FDA under an Emergency Use Authorization (EUA). This EUA will remain  in effect (meaning this test can be used) for the duration of the COVID-19 declaration under Section 564(b)(1) of the Act, 21 U.S.C. section 360bbb-3(b)(1), unless the authorization is terminated or revoked sooner.    Influenza A by PCR NEGATIVE NEGATIVE Final   Influenza B by PCR NEGATIVE NEGATIVE Final    Comment: (NOTE) The Xpert Xpress SARS-CoV-2/FLU/RSV assay is intended as an aid in  the diagnosis of influenza from Nasopharyngeal swab specimens and  should not be used as a sole basis for treatment. Nasal washings and  aspirates are unacceptable for Xpert Xpress SARS-CoV-2/FLU/RSV  testing. Fact Sheet for Patients: PinkCheek.be Fact Sheet for Healthcare Providers: GravelBags.it This test is not yet approved or cleared by the [place] FDA and  has been authorized for detection and/or diagnosis of SARS-CoV-2 by  FDA under an Emergency Use Authorization (EUA). This EUA will  remain  in effect (meaning this test can be used) for the duration of the  Covid-19 declaration under Section 564(b)(1) of the Act, 21  U.S.C. section 360bbb-3(b)(1), unless the authorization is  terminated or revoked. Performed  at [facility], [address] [place], [place] [address]    Studies/Results: MR 3D Recon At Scanner  Result Date: 07/08/2019 CLINICAL DATA:  Acute abdominal pain, generalized. EXAM: MRI ABDOMEN WITH CONTRAST (WITH MRCP) TECHNIQUE: Multiplanar multisequence MR imaging of the abdomen was performed following the administration of intravenous contrast. Heavily T2-weighted images of the biliary and pancreatic ducts were obtained, and three-dimensional MRCP images were rendered by post processing. CONTRAST:  7.28mL GADAVIST GADOBUTROL 1 MMOL/ML IV SOLN COMPARISON:  Ultrasound abdomen 07/08/2019 FINDINGS: Lower chest: Incidental imaging of the lung bases is unremarkable. Limited assessment on MRI. Hepatobiliary: Numerous gallstones layering dependently in the gallbladder also mixed with sludge. No signs of focal hepatic lesion. Gallbladder and gallbladder neck or filled with sludge. The cystic duct appears to join the common bile duct in the proximal third of the extrahepatic biliary tree. Just above the confluence there is sludge layering dependently in the common hepatic duct. There may also be some stones and or sludge layering dependently in the distal common bile duct. Minimal intrahepatic biliary ductal distension is present. The common bile duct measures 5 mm. Pancreas: Signs of acute interstitial pancreatitis. No focal pancreatic fluid, stranding throughout the anterior pararenal space. Pancreatic enhancement is maintained. The splenic vein is patent. Spleen:  Normal appearance of the spleen. Adrenals/Urinary Tract: Adrenal glands are normal. Renal contours are smooth. Stomach/Bowel: Perigastric and perienteric stranding in the setting of acute pancreatitis. No signs of acute bowel process. Vascular/Lymphatic: Patent abdominal vasculature. 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 Other:  None. Musculoskeletal: No suspicious bone lesions identified. IMPRESSION: 1. Signs of acute  interstitial pancreatitis. 2. Numerous gallstones layering dependently in the gallbladder and gallbladder neck. The cystic duct appears to join the common bile duct in the proximal third of the extrahepatic biliary tree. 3. Mild to moderate intrahepatic biliary ductal distension. Sludge and/or small stones in the common hepatic duct and common bile duct as described. Common bile duct is at 5 mm currently. 4. 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 Electronically Signed   By: Zetta Bills M.D.   On: 07/08/2019 16:08   MR ABDOMEN WITH MRCP W CONTRAST  Result Date: 07/08/2019 CLINICAL DATA:  Acute abdominal pain, generalized. EXAM: MRI ABDOMEN WITH CONTRAST (WITH MRCP) TECHNIQUE: Multiplanar multisequence MR imaging of the abdomen was performed following the administration of intravenous contrast. Heavily T2-weighted images of the biliary and pancreatic ducts were obtained, and three-dimensional MRCP images were rendered by post processing. CONTRAST:  7.1mL GADAVIST GADOBUTROL 1 MMOL/ML IV SOLN COMPARISON:  Ultrasound abdomen 07/08/2019 FINDINGS: Lower chest: Incidental imaging of the lung bases is unremarkable. Limited assessment on MRI. Hepatobiliary: Numerous gallstones layering dependently in the gallbladder also mixed with sludge. No signs of focal hepatic lesion. Gallbladder and gallbladder neck or filled with sludge. The cystic duct appears to join the common bile duct in the proximal third of the extrahepatic biliary tree. Just above the confluence there is sludge layering dependently in the common hepatic duct. There may also be some stones and or sludge layering dependently in the distal common bile duct. Minimal intrahepatic biliary ductal distension is present. The common bile duct measures 5 mm. Pancreas: Signs of acute interstitial pancreatitis. No focal pancreatic fluid, stranding throughout the anterior pararenal  space. Pancreatic enhancement is maintained. The splenic vein is  patent. Spleen:  Normal appearance of the spleen. Adrenals/Urinary Tract: Adrenal glands are normal. Renal contours are smooth. Stomach/Bowel: Perigastric and perienteric stranding in the setting of acute pancreatitis. No signs of acute bowel process. Vascular/Lymphatic: Patent abdominal vasculature. 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 Other:  None. Musculoskeletal: No suspicious bone lesions identified. IMPRESSION: 1. Signs of acute interstitial pancreatitis. 2. Numerous gallstones layering dependently in the gallbladder and gallbladder neck. The cystic duct appears to join the common bile duct in the proximal third of the extrahepatic biliary tree. 3. Mild to moderate intrahepatic biliary ductal distension. Sludge and/or small stones in the common hepatic duct and common bile duct as described. Common bile duct is at 5 mm currently. 4. 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 Electronically Signed   By: Zetta Bills M.D.   On: 07/08/2019 16:08   US Abdomen Limited RUQ  Result Date: 07/08/2019 CLINICAL DATA:  Upper abdominal pain with nausea and vomiting EXAM: ULTRASOUND ABDOMEN LIMITED RIGHT UPPER QUADRANT COMPARISON:  Ultrasound right upper quadrant April 26, 2019 FINDINGS: Gallbladder: Within the gallbladder, there are multiple echogenic foci which move and shadow consistent with cholelithiasis. Largest individual gallstone measures 7 mm in length. There is also sludge in the gallbladder. The gallbladder wall is borderline thickened with trace pericholecystic fluid. No sonographic Murphy sign noted by sonographer. Common bile duct: Diameter: 4 mm. No intrahepatic or extrahepatic biliary duct dilatation. Liver: No focal lesion identified. Within normal limits in parenchymal echogenicity. Portal vein is patent on color Doppler imaging with normal direction of blood flow towards the liver. Other: None. IMPRESSION: Cholelithiasis and sludge within the gallbladder. Gallbladder  wall borderline thickened with trace pericholecystic fluid. These findings are concerning for a degree of acute cholecystitis. Study otherwise unremarkable. Electronically Signed   By: Lowella Grip III M.D.   On: 07/08/2019 13:22   Medications: I have reviewed the patient's current medications. Scheduled Meds: . enoxaparin (LOVENOX) injection  40 mg Subcutaneous Q24H   Continuous Infusions: . lactated ringers 125 mL/hr at 07/09/19 1003  . piperacillin-tazobactam (ZOSYN)  IV 3.375 g (07/09/19 0847)   PRN Meds:.HYDROmorphone (DILAUDID) injection, ondansetron **OR** ondansetron (ZOFRAN) IV   Assessment: Principal Problem:   Pancreatitis, gallstone Active Problems:   Iron deficiency anemia   Acute cholecystitis  Ann Stevens 43 y.o. female admitted on 07/08/2019 with gallstone pancreatitis and choledocholithiasis seen on MRCP.  On antibiotics for acute cholecystitis as well.  Surgery following.  Prior admission in October 2020 where no cholecystitis was seen on ultrasound.  Was being worked up as an outpatient for abdominal pain.  Plan: 1.  IV Ringer lactate : e fluid of choice for acute pancreatitis.  She appears to be adequately rehydrated as noted to have a drop in the creatinine and hemoglobin which is appropriate.  No worsening of the total bilirubin, no leucocytosis and afebrile.  No clinical evidence of cholangitis  2.  Continue antibiotics, ERCP on Monday   3.  Can commence on clear liquids when she feels ready to do so.  LOS: 1 day   Jonathon Bellows, MD 07/09/2019, 10:19 AM

## 2019-07-09 NOTE — Anesthesia Preprocedure Evaluation (Addendum)
Anesthesia Evaluation  Patient identified by MRN, date of birth, ID band Patient awake    Reviewed: Allergy & Precautions, H&P , NPO status , Patient's Chart, lab work & pertinent test results  Airway Mallampati: III  TM Distance: >3 FB Neck ROM: full    Dental  (+) Teeth Intact   Pulmonary neg pulmonary ROS, neg COPD,           Cardiovascular (-) hypertension(-) angina(-) Past MI negative cardio ROS  (-) dysrhythmias      Neuro/Psych negative neurological ROS  negative psych ROS   GI/Hepatic Neg liver ROS, GERD  ,  Endo/Other  negative endocrine ROS  Renal/GU      Musculoskeletal   Abdominal   Peds  Hematology  (+) Blood dyscrasia, anemia ,   Anesthesia Other Findings Past Medical History: No date: Anemia, iron deficiency   Reproductive/Obstetrics negative OB ROS                           Anesthesia Physical Anesthesia Plan  ASA: II  Anesthesia Plan: General ETT and Rapid Sequence   Post-op Pain Management:    Induction:   PONV Risk Score and Plan: Ondansetron, Dexamethasone and Treatment may vary due to age or medical condition  Airway Management Planned: Video Laryngoscope Planned  Additional Equipment:   Intra-op Plan:   Post-operative Plan:   Informed Consent: I have reviewed the patients History and Physical, chart, labs and discussed the procedure including the risks, benefits and alternatives for the proposed anesthesia with the patient or authorized representative who has indicated his/her understanding and acceptance.     Dental Advisory Given  Plan Discussed with: Anesthesiologist  Anesthesia Plan Comments:        Anesthesia Quick Evaluation

## 2019-07-10 ENCOUNTER — Inpatient Hospital Stay

## 2019-07-10 ENCOUNTER — Inpatient Hospital Stay: Admitting: Anesthesiology

## 2019-07-10 ENCOUNTER — Encounter
Admission: EM | Disposition: A | Discharge status: Home / Self Care | Payer: Self-pay | Source: Home / Self Care | Attending: Internal Medicine

## 2019-07-10 HISTORY — PX: CHOLECYSTECTOMY: SHX55

## 2019-07-10 LAB — CBC
HCT: 32 % — ABNORMAL LOW (ref 36.0–46.0)
Hemoglobin: 10.4 g/dL — ABNORMAL LOW (ref 12.0–15.0)
MCH: 24.9 pg — ABNORMAL LOW (ref 26.0–34.0)
MCHC: 32.5 g/dL (ref 30.0–36.0)
MCV: 76.6 fL — ABNORMAL LOW (ref 80.0–100.0)
Platelets: 283 10*3/uL (ref 150–400)
RBC: 4.18 MIL/uL (ref 3.87–5.11)
RDW: 17.1 % — ABNORMAL HIGH (ref 11.5–15.5)
WBC: 13 10*3/uL — ABNORMAL HIGH (ref 4.0–10.5)
nRBC: 0 % (ref 0.0–0.2)

## 2019-07-10 LAB — HIV ANTIBODY (ROUTINE TESTING W REFLEX): HIV Screen 4th Generation wRfx: NONREACTIVE

## 2019-07-10 LAB — SURGICAL PCR SCREEN
MRSA, PCR: NEGATIVE
Staphylococcus aureus: POSITIVE — AB

## 2019-07-10 LAB — COMPREHENSIVE METABOLIC PANEL
ALT: 75 U/L — ABNORMAL HIGH (ref 0–44)
AST: 29 U/L (ref 15–41)
Albumin: 3.1 g/dL — ABNORMAL LOW (ref 3.5–5.0)
Alkaline Phosphatase: 92 U/L (ref 38–126)
Anion gap: 10 (ref 5–15)
BUN: <5 mg/dL — ABNORMAL LOW (ref 6–20)
CO2: 21 mmol/L — ABNORMAL LOW (ref 22–32)
Calcium: 8.3 mg/dL — ABNORMAL LOW (ref 8.9–10.3)
Chloride: 103 mmol/L (ref 98–111)
Creatinine, Ser: 0.56 mg/dL (ref 0.44–1.00)
GFR calc Af Amer: >60 mL/min (ref >60)
GFR calc non Af Amer: >60 mL/min (ref >60)
Glucose, Bld: 92 mg/dL (ref 70–99)
Potassium: 3.2 mmol/L — ABNORMAL LOW (ref 3.5–5.1)
Sodium: 134 mmol/L — ABNORMAL LOW (ref 135–145)
Total Bilirubin: 1.5 mg/dL — ABNORMAL HIGH (ref 0.3–1.2)
Total Protein: 7 g/dL (ref 6.5–8.1)

## 2019-07-10 LAB — PHOSPHORUS: Phosphorus: 2.8 mg/dL (ref 2.5–4.6)

## 2019-07-10 LAB — MAGNESIUM: Magnesium: 1.5 mg/dL — ABNORMAL LOW (ref 1.7–2.4)

## 2019-07-10 LAB — LIPASE, BLOOD: Lipase: 54 U/L — ABNORMAL HIGH (ref 11–51)

## 2019-07-10 SURGERY — LAPAROSCOPIC CHOLECYSTECTOMY WITH INTRAOPERATIVE CHOLANGIOGRAM
Anesthesia: General

## 2019-07-10 MED ORDER — ACETAMINOPHEN 325 MG PO TABS: 650.0000 mg | ORAL_TABLET | Freq: Four times a day (QID) | ORAL | Status: DC | PRN

## 2019-07-10 MED ORDER — OXYCODONE HCL 5 MG/5ML PO SOLN: 5.0000 mg | Freq: Once | ORAL | Status: DC | PRN

## 2019-07-10 MED ORDER — ACETAMINOPHEN 10 MG/ML IV SOLN
INTRAVENOUS | Status: AC
  Filled 2019-07-10: qty 100

## 2019-07-10 MED ORDER — MIDAZOLAM HCL 2 MG/2ML IJ SOLN
INTRAMUSCULAR | Status: DC | PRN
  Administered 2019-07-10: 2 mg via INTRAVENOUS

## 2019-07-10 MED ORDER — FENTANYL CITRATE (PF) 100 MCG/2ML IJ SOLN
INTRAMUSCULAR | Status: AC
  Filled 2019-07-10: qty 2

## 2019-07-10 MED ORDER — FENTANYL CITRATE (PF) 100 MCG/2ML IJ SOLN
INTRAMUSCULAR | Status: DC | PRN
  Administered 2019-07-10: 25 ug via INTRAVENOUS
  Administered 2019-07-10 (×2): 50 ug via INTRAVENOUS
  Administered 2019-07-10: 25 ug via INTRAVENOUS
  Administered 2019-07-10: 50 ug via INTRAVENOUS

## 2019-07-10 MED ORDER — SODIUM CHLORIDE 0.9 % IV SOLN
INTRAVENOUS | Status: DC | PRN
  Administered 2019-07-10: 15 mL

## 2019-07-10 MED ORDER — LIDOCAINE HCL (CARDIAC) PF 100 MG/5ML IV SOSY
PREFILLED_SYRINGE | INTRAVENOUS | Status: DC | PRN
  Administered 2019-07-10: 80 mg via INTRAVENOUS

## 2019-07-10 MED ORDER — DEXAMETHASONE SODIUM PHOSPHATE 10 MG/ML IJ SOLN
INTRAMUSCULAR | Status: DC | PRN
  Administered 2019-07-10: 10 mg via INTRAVENOUS

## 2019-07-10 MED ORDER — MAGNESIUM SULFATE 2 GM/50ML IV SOLN
2.0000 g | Freq: Once | INTRAVENOUS | Status: AC
  Administered 2019-07-10: 2 g via INTRAVENOUS
  Filled 2019-07-10: qty 50

## 2019-07-10 MED ORDER — FENTANYL CITRATE (PF) 100 MCG/2ML IJ SOLN
25.0000 ug | INTRAMUSCULAR | Status: DC | PRN
  Administered 2019-07-10 (×4): 25 ug via INTRAVENOUS

## 2019-07-10 MED ORDER — LIDOCAINE HCL 2 % IJ SOLN
INTRAMUSCULAR | Status: AC
  Filled 2019-07-10: qty 10

## 2019-07-10 MED ORDER — ROCURONIUM BROMIDE 100 MG/10ML IV SOLN
INTRAVENOUS | Status: DC | PRN
  Administered 2019-07-10: 30 mg via INTRAVENOUS
  Administered 2019-07-10: 20 mg via INTRAVENOUS

## 2019-07-10 MED ORDER — PROPOFOL 10 MG/ML IV BOLUS
INTRAVENOUS | Status: AC
  Filled 2019-07-10: qty 20

## 2019-07-10 MED ORDER — OXYCODONE HCL 5 MG PO TABS
5.0000 mg | ORAL_TABLET | ORAL | Status: DC | PRN
  Administered 2019-07-10 – 2019-07-11 (×2): 5 mg via ORAL
  Filled 2019-07-10 (×2): qty 1

## 2019-07-10 MED ORDER — PROMETHAZINE HCL 25 MG/ML IJ SOLN: 6.2500 mg | INTRAMUSCULAR | Status: DC | PRN

## 2019-07-10 MED ORDER — IBUPROFEN 400 MG PO TABS: 600.0000 mg | ORAL_TABLET | ORAL | Status: DC | PRN

## 2019-07-10 MED ORDER — KETOROLAC TROMETHAMINE 30 MG/ML IJ SOLN
INTRAMUSCULAR | Status: DC | PRN
  Administered 2019-07-10: 30 mg via INTRAVENOUS

## 2019-07-10 MED ORDER — PROPOFOL 10 MG/ML IV BOLUS
INTRAVENOUS | Status: DC | PRN
  Administered 2019-07-10: 160 mg via INTRAVENOUS

## 2019-07-10 MED ORDER — POTASSIUM CHLORIDE 10 MEQ/100ML IV SOLN
10.0000 meq | INTRAVENOUS | Status: DC
  Filled 2019-07-10: qty 100

## 2019-07-10 MED ORDER — KETOROLAC TROMETHAMINE 30 MG/ML IJ SOLN
INTRAMUSCULAR | Status: AC
  Filled 2019-07-10: qty 1

## 2019-07-10 MED ORDER — SUGAMMADEX SODIUM 200 MG/2ML IV SOLN
INTRAVENOUS | Status: DC | PRN
  Administered 2019-07-10: 163.2 mg via INTRAVENOUS

## 2019-07-10 MED ORDER — LIDOCAINE-EPINEPHRINE 1 %-1:100000 IJ SOLN
INTRAMUSCULAR | Status: DC | PRN
  Administered 2019-07-10: 15 mL via SUBCUTANEOUS

## 2019-07-10 MED ORDER — ROCURONIUM BROMIDE 50 MG/5ML IV SOLN
INTRAVENOUS | Status: AC
  Filled 2019-07-10: qty 1

## 2019-07-10 MED ORDER — SUCCINYLCHOLINE CHLORIDE 20 MG/ML IJ SOLN
INTRAMUSCULAR | Status: DC | PRN
  Administered 2019-07-10: 120 mg via INTRAVENOUS

## 2019-07-10 MED ORDER — SEVOFLURANE IN SOLN
RESPIRATORY_TRACT | Status: AC
  Filled 2019-07-10: qty 250

## 2019-07-10 MED ORDER — MIDAZOLAM HCL 2 MG/2ML IJ SOLN
INTRAMUSCULAR | Status: AC
  Filled 2019-07-10: qty 2

## 2019-07-10 MED ORDER — SUCCINYLCHOLINE CHLORIDE 20 MG/ML IJ SOLN
INTRAMUSCULAR | Status: AC
  Filled 2019-07-10: qty 1

## 2019-07-10 MED ORDER — POTASSIUM CHLORIDE 10 MEQ/100ML IV SOLN
10.0000 meq | INTRAVENOUS | Status: AC
  Administered 2019-07-10 (×4): 10 meq via INTRAVENOUS
  Filled 2019-07-10 (×3): qty 100

## 2019-07-10 MED ORDER — KCL IN DEXTROSE-NACL 20-5-0.9 MEQ/L-%-% IV SOLN
INTRAVENOUS | Status: DC
  Filled 2019-07-10 (×4): qty 1000

## 2019-07-10 MED ORDER — HYDROMORPHONE HCL 1 MG/ML IJ SOLN
0.5000 mg | INTRAMUSCULAR | Status: DC | PRN
  Administered 2019-07-10 – 2019-07-11 (×2): 0.5 mg via INTRAVENOUS
  Filled 2019-07-10 (×2): qty 0.5

## 2019-07-10 MED ORDER — OXYCODONE HCL 5 MG PO TABS: 5.0000 mg | ORAL_TABLET | Freq: Once | ORAL | Status: DC | PRN

## 2019-07-10 SURGICAL SUPPLY — 46 items
APPLIER CLIP 5 13 M/L LIGAMAX5 (MISCELLANEOUS) ×2
BLADE SURG SZ11 CARB STEEL (BLADE) ×2 IMPLANT
CANISTER SUCT 1200ML W/VALVE (MISCELLANEOUS) ×2 IMPLANT
CATH CHOLANGI 4FR 420404F (CATHETERS) ×2 IMPLANT
CHLORAPREP W/TINT 26 (MISCELLANEOUS) ×2 IMPLANT
CLIP APPLIE 5 13 M/L LIGAMAX5 (MISCELLANEOUS) ×1 IMPLANT
COVER WAND RF STERILE (DRAPES) IMPLANT
DECANTER SPIKE VIAL GLASS SM (MISCELLANEOUS) IMPLANT
DEFOGGER SCOPE WARMER CLEARIFY (MISCELLANEOUS) ×2 IMPLANT
DERMABOND ADVANCED (GAUZE/BANDAGES/DRESSINGS) ×1
DERMABOND ADVANCED .7 DNX12 (GAUZE/BANDAGES/DRESSINGS) ×1 IMPLANT
DRAPE C-ARM XRAY 36X54 (DRAPES) ×2 IMPLANT
ELECT CAUTERY BLADE TIP 2.5 (TIP) ×2
ELECT REM PT RETURN 9FT ADLT (ELECTROSURGICAL) ×2
ELECTRODE CAUTERY BLDE TIP 2.5 (TIP) ×1 IMPLANT
ELECTRODE REM PT RTRN 9FT ADLT (ELECTROSURGICAL) ×1 IMPLANT
GLOVE BIO SURGEON STRL SZ 6.5 (GLOVE) ×2 IMPLANT
GLOVE INDICATOR 7.0 STRL GRN (GLOVE) ×2 IMPLANT
GOWN STRL REUS W/ TWL LRG LVL3 (GOWN DISPOSABLE) ×3 IMPLANT
GOWN STRL REUS W/TWL LRG LVL3 (GOWN DISPOSABLE) ×3
GRASPER SUT TROCAR 14GX15 (MISCELLANEOUS) ×2 IMPLANT
IRRIGATION STRYKERFLOW (MISCELLANEOUS) ×1 IMPLANT
IRRIGATOR STRYKERFLOW (MISCELLANEOUS) ×2
IV NS 1000ML (IV SOLUTION) ×1
IV NS 1000ML BAXH (IV SOLUTION) ×1 IMPLANT
KIT TURNOVER KIT A (KITS) ×2 IMPLANT
LABEL OR SOLS (LABEL) ×2 IMPLANT
NEEDLE HYPO 22GX1.5 SAFETY (NEEDLE) ×2 IMPLANT
NS IRRIG 500ML POUR BTL (IV SOLUTION) ×2 IMPLANT
PACK LAP CHOLECYSTECTOMY (MISCELLANEOUS) ×2 IMPLANT
PENCIL ELECTRO HAND CTR (MISCELLANEOUS) ×2 IMPLANT
POUCH SPECIMEN RETRIEVAL 10MM (ENDOMECHANICALS) IMPLANT
SCISSORS METZENBAUM CVD 33 (INSTRUMENTS) ×2 IMPLANT
SET TUBE SMOKE EVAC HIGH FLOW (TUBING) ×2 IMPLANT
SLEEVE ADV FIXATION 5X100MM (TROCAR) ×6 IMPLANT
SOLUTION ELECTROLUBE (MISCELLANEOUS) ×2 IMPLANT
STRIP CLOSURE SKIN 1/2X4 (GAUZE/BANDAGES/DRESSINGS) ×2 IMPLANT
SUT MNCRL 4-0 (SUTURE) ×1
SUT MNCRL 4-0 27XMFL (SUTURE) ×1
SUT VIC AB 3-0 SH 27 (SUTURE) ×1
SUT VIC AB 3-0 SH 27X BRD (SUTURE) ×1 IMPLANT
SUT VICRYL 0 AB UR-6 (SUTURE) ×4 IMPLANT
SUTURE MNCRL 4-0 27XMF (SUTURE) ×1 IMPLANT
TROCAR ADV FIXATION 12X100MM (TROCAR) IMPLANT
TROCAR Z-THREAD OPTICAL 5X100M (TROCAR) ×2 IMPLANT
WATER STERILE IRR 1000ML POUR (IV SOLUTION) ×2 IMPLANT

## 2019-07-10 NOTE — Transfer of Care (Signed)
Immediate Anesthesia Transfer of Care Note  Patient: Ann Stevens  Procedure(s) Performed: LAPAROSCOPIC CHOLECYSTECTOMY WITH INTRAOPERATIVE CHOLANGIOGRAM (N/A )  Patient Location: PACU  Anesthesia Type:General  Level of Consciousness: awake, alert  and oriented  Airway & Oxygen Therapy: Patient Spontanous Breathing and Patient connected to face mask oxygen  Post-op Assessment: Report given to RN and Post -op Vital signs reviewed and stable  Post vital signs: Reviewed and stable  Last Vitals:  Vitals Value Taken Time  BP 123/50 07/10/19 1225  Temp 37.1 C 07/10/19 1224  Pulse 94 07/10/19 1230  Resp 17 07/10/19 1230  SpO2 100 % 07/10/19 1230  Vitals shown include unvalidated device data.  Last Pain:  Vitals:   07/10/19 1224  TempSrc:   PainSc: Asleep         Complications: No apparent anesthesia complications

## 2019-07-10 NOTE — Anesthesia Post-op Follow-up Note (Signed)
Anesthesia QCDR form completed.        

## 2019-07-10 NOTE — Progress Notes (Signed)
Discussed with Dr Elinor Dodge- no stones in CBD on I/O cholangiogram- therefore no need for ERCP   Will sign off , will see her back in the office for follow up   Dr Jonathon Bellows MD,MRCP [facility]) Gastroenterology/Hepatology Pager: [phone]

## 2019-07-10 NOTE — Op Note (Signed)
Laparoscopic Cholecystectomy  Pre-operative Diagnosis: Gallstone pancreatitis  Post-operative Diagnosis: Same  Procedure: Laparoscopic cholecystectomy with intraoperative cholangiogram  Surgeon: Fredirick Maudlin, MD  Anesthesia: GETA  Assistant: None   Findings: There was clear flow of contrast into the common bile duct and bowel, indicating that what ever obstruction had been present has cleared.  There was gallbladder wall edema, consistent with acute cholecystitis   Estimated Blood Loss: Less than 10 cc         Drains: None         Specimens: Gallbladder           Complications: none   Procedure Details  The patient was seen again in the preoperative holding area. The benefits, complications, treatment options, and expected outcomes were discussed with the patient. The risks of bleeding, infection, recurrence of symptoms, failure to resolve symptoms, bile duct damage, bile duct leak, retained common bile duct stone, bowel injury, any of which could require further surgery and/or ERCP, stent, or papillotomy were reviewed with the patient. The likelihood of improving the patient's symptoms with return to their baseline status is good.  The patient and/or family concurred with the proposed plan, giving informed consent.  The patient was taken to operating room, identified as Shon Millet and the procedure verified as Laparoscopic Cholecystectomy. A time out was performed and the above information confirmed.  Prior to the induction of general anesthesia, antibiotic prophylaxis was administered. VTE prophylaxis was in place. General endotracheal anesthesia was then administered and tolerated well. After the induction, the abdomen was prepped with Chloraprep and draped in the sterile fashion. The patient was positioned in the supine position.  Optiview technique was utilized to enter the abdomen in the right upper quadrant via a 5 mm trocar.  Pneumoperitoneum was then created with CO2  and tolerated well without any adverse changes in the patient's vital signs.  A 10 mm port was placed just above the umbilicus and 2 additional 5-mm ports were placed in the right upper quadrant, all under direct vision. All skin incisions  were infiltrated with a local anesthetic agent before making the incision and placing the trocars.   The patient was positioned  in reverse Trendelenburg, tilted slightly to the patient's left.  The gallbladder was identified, the fundus grasped and retracted cephalad. Adhesions were lysed bluntly. The infundibulum was grasped and retracted laterally, exposing the peritoneum overlying the triangle of Calot. This was then divided and exposed in a blunt fashion. An extended critical view of the cystic duct and cystic artery was obtained.  The cystic duct was clearly identified and bluntly dissected free.  The cystic artery was doubly clipped and divided.  The cystic duct was clipped on the gallbladder side.  A ductotomy was made and a cholangiocatheter was introduced into the cystic duct.  It was secured in place with a second clip.  There was free flow of saline and bile was withdrawn on aspiration.  A C-arm was brought into the field.  Contrast diluted with saline was then injected into the cystic duct.  There was filling of the common bile duct distally and common hepatic duct and intrahepatic ducts proximally.  There was no evidence of persistent obstruction.  The cholangiocatheter was removed.  The distal cystic duct was doubly clipped and the cystic duct transected.  The gallbladder was taken from the gallbladder fossa in a retrograde fashion with the electrocautery.  The gallbladder was inadvertently entered, spilling bile and multiple small stones into the abdominal cavity.  The gallbladder was removed and placed in an Endo pouch bag.  Copious irrigation was performed and the small gallbladder stones were aspirated free along with the bile.  The liver bed was irrigated  and inspected. Hemostasis was achieved with the electrocautery. Copious saline irrigation was utilized and was repeatedly aspirated until clear.  The gallbladder and Endo pouch sac were then removed through a port site.    Inspection of the right upper quadrant was performed. No bleeding, bile duct injury or leak, or bowel injury was noted. Pneumoperitoneum was released.  The periumbilical port site was closed with interrumpted 0 Vicryl sutures. 4-0 subcuticular Monocryl was used to close the skin. Dermabond was applied.  The patient was then extubated and brought to the recovery room in stable condition. Sponge, lap, and needle counts were correct at closure and at the conclusion of the case.               Fredirick Maudlin, MD, FACS

## 2019-07-10 NOTE — Anesthesia Postprocedure Evaluation (Signed)
Anesthesia Post Note  Patient: Ann Stevens  Procedure(s) Performed: LAPAROSCOPIC CHOLECYSTECTOMY WITH INTRAOPERATIVE CHOLANGIOGRAM (N/A )  Patient location during evaluation: PACU Anesthesia Type: General Level of consciousness: awake and alert Pain management: pain level controlled Vital Signs Assessment: post-procedure vital signs reviewed and stable Respiratory status: spontaneous breathing, nonlabored ventilation and respiratory function stable Cardiovascular status: blood pressure returned to baseline and stable Postop Assessment: no apparent nausea or vomiting Anesthetic complications: no     Last Vitals:  Vitals:   07/10/19 1304 07/10/19 1309  BP:  (!) 117/55  Pulse: 82 74  Resp: 14 13  Temp:    SpO2: 94% 94%    Last Pain:  Vitals:   07/10/19 1422  TempSrc:   PainSc: [place]

## 2019-07-10 NOTE — Anesthesia Procedure Notes (Signed)
Procedure Name: Intubation Date/Time: 07/10/2019 10:17 AM Performed by: Allean Found, CRNA Pre-anesthesia Checklist: Patient identified, Patient being monitored, Timeout performed, Emergency Drugs available and Suction available Patient Re-evaluated:Patient Re-evaluated prior to induction Oxygen Delivery Method: Circle system utilized Preoxygenation: Pre-oxygenation with 100% oxygen Induction Type: IV induction Ventilation: Mask ventilation without difficulty Laryngoscope Size: 3, McGraph and 4 Grade View: Grade I Tube type: Oral Tube size: 7.0 mm Number of attempts: 1 Airway Equipment and Method: Stylet Placement Confirmation: ETT inserted through vocal cords under direct vision,  positive ETCO2 and breath sounds checked- equal and bilateral Secured at: 21 cm Tube secured with: Tape Dental Injury: Teeth and Oropharynx as per pre-operative assessment

## 2019-07-10 NOTE — Progress Notes (Signed)
[facility] [place] NAME: Ann Stevens    MR#:  [id number]  DATE OF BIRTH:  19-Feb-1976  SUBJECTIVE:   Just got back from surgery,  resting REVIEW OF SYSTEMS:   Review of Systems  Constitutional: Negative for chills, fever and weight loss.  HENT: Negative for ear discharge, ear pain and nosebleeds.   Eyes: Negative for blurred vision, pain and discharge.  Respiratory: Negative for sputum production, shortness of breath, wheezing and stridor.   Cardiovascular: Negative for chest pain, palpitations, orthopnea and PND.  Gastrointestinal: Positive for abdominal pain. Negative for diarrhea, nausea and vomiting.  Genitourinary: Negative for frequency and urgency.  Musculoskeletal: Negative for back pain and joint pain.  Neurological: Negative for sensory change, speech change, focal weakness and weakness.  Psychiatric/Behavioral: Negative for depression and hallucinations. The patient is not nervous/anxious.    Tolerating Diet: clear Tolerating PT: self ambulatory  DRUG ALLERGIES:  No Known Allergies  VITALS:  Blood pressure (!) 117/55, pulse 74, temperature 97.8 F (36.6 C), resp. rate 13, height 5\' 5"  (1.651 m), weight 81.6 kg, SpO2 94 %.  PHYSICAL EXAMINATION:   Physical Exam  GENERAL:  43 y.o.-year-old patient lying in the bed with no acute distress.  EYES: Pupils equal, round, reactive to light and accommodation. No scleral icterus. Extraocular muscles intact.  HEENT: Head atraumatic, normocephalic. Oropharynx and nasopharynx clear.  NECK:  Supple, no jugular venous distention. No thyroid enlargement, no tenderness.  LUNGS: Normal breath sounds bilaterally, no wheezing, rales, rhonchi. No use of accessory muscles of respiration.  CARDIOVASCULAR: S1, S2 normal. No murmurs, rubs, or gallops.  ABDOMEN: Soft, nontender, nondistended. Bowel sounds present. No organomegaly or mass. Laparoscopic incisions+ EXTREMITIES: No cyanosis,  clubbing or edema b/l.    NEUROLOGIC: Cranial nerves II through XII are intact. No focal Motor or sensory deficits b/l.   PSYCHIATRIC:  patient is alert and oriented x 3.  SKIN: No obvious rash, lesion, or ulcer.   LABORATORY PANEL:  CBC Recent Labs  Lab 07/10/19 0350  WBC 13.0*  HGB 10.4*  HCT 32.0*  PLT 283    Chemistries  Recent Labs  Lab 07/10/19 0350  NA 134*  K 3.2*  CL 103  CO2 21*  GLUCOSE 92  BUN <5*  CREATININE 0.56  CALCIUM 8.3*  MG 1.5*  AST 29  ALT 75*  ALKPHOS 92  BILITOT 1.5*   Cardiac Enzymes No results for input(s): TROPONINI in the last 168 hours. RADIOLOGY:  DG Cholangiogram Operative  Result Date: 07/10/2019 CLINICAL DATA:  43 year old female undergoing laparoscopic cholecystectomy EXAM: INTRAOPERATIVE CHOLANGIOGRAM TECHNIQUE: Cholangiographic images from the C-arm fluoroscopic device were submitted for interpretation post-operatively. Please see the procedural report for the amount of contrast and the fluoroscopy time utilized. COMPARISON:  MRI of the abdomen 07/08/2019 FINDINGS: Intraoperative cine clips are submitted for review. The images demonstrate cannulation of the cystic duct remanent and intraoperative cholangiogram. No evidence of biliary ductal dilatation. No focal stenosis, stricture or evidence of choledocholithiasis. The ampulla and duodenum are not included in the field of view. Patency of the ampulla cannot be confirmed radiographically. IMPRESSION: No focal abnormality within the imaged portion of the biliary tree at the time of intraoperative cholangiogram. The ampulla and duodenum were not included in the field of view. Electronically Signed   By: Jacqulynn Cadet M.D.   On: 07/10/2019 13:23   MR 3D Recon At Scanner  Result Date: 07/08/2019 CLINICAL DATA:  Acute abdominal pain, generalized. EXAM:  MRI ABDOMEN WITH CONTRAST (WITH MRCP) TECHNIQUE: Multiplanar multisequence MR imaging of the abdomen was performed following the  administration of intravenous contrast. Heavily T2-weighted images of the biliary and pancreatic ducts were obtained, and three-dimensional MRCP images were rendered by post processing. CONTRAST:  7.11mL GADAVIST GADOBUTROL 1 MMOL/ML IV SOLN COMPARISON:  Ultrasound abdomen 07/08/2019 FINDINGS: Lower chest: Incidental imaging of the lung bases is unremarkable. Limited assessment on MRI. Hepatobiliary: Numerous gallstones layering dependently in the gallbladder also mixed with sludge. No signs of focal hepatic lesion. Gallbladder and gallbladder neck or filled with sludge. The cystic duct appears to join the common bile duct in the proximal third of the extrahepatic biliary tree. Just above the confluence there is sludge layering dependently in the common hepatic duct. There may also be some stones and or sludge layering dependently in the distal common bile duct. Minimal intrahepatic biliary ductal distension is present. The common bile duct measures 5 mm. Pancreas: Signs of acute interstitial pancreatitis. No focal pancreatic fluid, stranding throughout the anterior pararenal space. Pancreatic enhancement is maintained. The splenic vein is patent. Spleen:  Normal appearance of the spleen. Adrenals/Urinary Tract: Adrenal glands are normal. Renal contours are smooth. Stomach/Bowel: Perigastric and perienteric stranding in the setting of acute pancreatitis. No signs of acute bowel process. Vascular/Lymphatic: Patent abdominal vasculature. 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 Other:  None. Musculoskeletal: No suspicious bone lesions identified. IMPRESSION: 1. Signs of acute interstitial pancreatitis. 2. Numerous gallstones layering dependently in the gallbladder and gallbladder neck. The cystic duct appears to join the common bile duct in the proximal third of the extrahepatic biliary tree. 3. Mild to moderate intrahepatic biliary ductal distension. Sludge and/or small stones in the common hepatic duct  and common bile duct as described. Common bile duct is at 5 mm currently. 4. 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 Electronically Signed   By: Zetta Bills M.D.   On: 07/08/2019 16:08   MR ABDOMEN WITH MRCP W CONTRAST  Result Date: 07/08/2019 CLINICAL DATA:  Acute abdominal pain, generalized. EXAM: MRI ABDOMEN WITH CONTRAST (WITH MRCP) TECHNIQUE: Multiplanar multisequence MR imaging of the abdomen was performed following the administration of intravenous contrast. Heavily T2-weighted images of the biliary and pancreatic ducts were obtained, and three-dimensional MRCP images were rendered by post processing. CONTRAST:  7.60mL GADAVIST GADOBUTROL 1 MMOL/ML IV SOLN COMPARISON:  Ultrasound abdomen 07/08/2019 FINDINGS: Lower chest: Incidental imaging of the lung bases is unremarkable. Limited assessment on MRI. Hepatobiliary: Numerous gallstones layering dependently in the gallbladder also mixed with sludge. No signs of focal hepatic lesion. Gallbladder and gallbladder neck or filled with sludge. The cystic duct appears to join the common bile duct in the proximal third of the extrahepatic biliary tree. Just above the confluence there is sludge layering dependently in the common hepatic duct. There may also be some stones and or sludge layering dependently in the distal common bile duct. Minimal intrahepatic biliary ductal distension is present. The common bile duct measures 5 mm. Pancreas: Signs of acute interstitial pancreatitis. No focal pancreatic fluid, stranding throughout the anterior pararenal space. Pancreatic enhancement is maintained. The splenic vein is patent. Spleen:  Normal appearance of the spleen. Adrenals/Urinary Tract: Adrenal glands are normal. Renal contours are smooth. Stomach/Bowel: Perigastric and perienteric stranding in the setting of acute pancreatitis. No signs of acute bowel process. Vascular/Lymphatic: Patent abdominal vasculature. 8 mm splenic artery aneurysm near the  splenic hilum. Other:  None. Musculoskeletal: No suspicious bone lesions identified. IMPRESSION: 1. Signs  of acute interstitial pancreatitis. 2. Numerous gallstones layering dependently in the gallbladder and gallbladder neck. The cystic duct appears to join the common bile duct in the proximal third of the extrahepatic biliary tree. 3. Mild to moderate intrahepatic biliary ductal distension. Sludge and/or small stones in the common hepatic duct and common bile duct as described. Common bile duct is at 5 mm currently. 4. 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 Electronically Signed   By: Zetta Bills M.D.   On: 07/08/2019 16:08   ASSESSMENT AND PLAN:  Ann Stevens is a 44 y.o. female with medical history significant of iron deficiency anemia who presents to the ER with sudden onset of right upper quadrant abdominal pain in the last 2 days.  She had similar symptoms 2 months ago but resolved spontaneously.  But this time around pain was as high as 9 out of 10  #1 acute gallstone pancreatitis:  -  IV fluids with Lactate Ringers -IV and po prn pain management  -Prn Zofran for nausea vomiting -empiric IV Zosyn -lipase trending down 2287-- 324 -bilirubin and LFTs trending down -patient overall feels better with abdominal pain -MRCP confirmed gallstones, ??distal CBD stone/sludge  #2 acute cholecystitis:  -Secondary to gallstones.  - seen by surgery s/p lap cholecystectomy today -intraoperative cholangiogram negative for CBD stone- -per G.I. no indication for ERCP  #3 iron deficiency anemia: Continue monitoring H&H.   DVT prophylaxis: Lovenox Code Status: Full code Family Communication: No family at bedside Disposition Plan: Home Consults called: gastroenterology and Surgery  Admission status: Inpatient   TOTAL TIME TAKING CARE OF THIS PATIENT: 25 minutes.  >50% time spent on counselling and coordination of care  POSSIBLE D/C IN *1-3* DAYS, DEPENDING ON CLINICAL  CONDITION.  Note: This dictation was prepared with Dragon dictation along with smaller phrase technology. Any transcriptional errors that result from this process are unintentional.  Fritzi Mandes M.D on 07/10/2019 at 2:25 PM  Between 7am to 6pm - Pager - [phone]  After 6pm go to [email or web address]  [facility]   CC: Primary care physician; Burnard Hawthorne, FNPPatient ID: Ann Stevens, female   DOB: 1975-11-13, 43 y.o.   MRN: [id number]

## 2019-07-11 LAB — CBC
HCT: 30.1 % — ABNORMAL LOW (ref 36.0–46.0)
Hemoglobin: 9.4 g/dL — ABNORMAL LOW (ref 12.0–15.0)
MCH: 24.8 pg — ABNORMAL LOW (ref 26.0–34.0)
MCHC: 31.2 g/dL (ref 30.0–36.0)
MCV: 79.4 fL — ABNORMAL LOW (ref 80.0–100.0)
Platelets: 283 10*3/uL (ref 150–400)
RBC: 3.79 MIL/uL — ABNORMAL LOW (ref 3.87–5.11)
RDW: 17.1 % — ABNORMAL HIGH (ref 11.5–15.5)
WBC: 8.8 10*3/uL (ref 4.0–10.5)
nRBC: 0 % (ref 0.0–0.2)

## 2019-07-11 LAB — COMPREHENSIVE METABOLIC PANEL
ALT: 80 U/L — ABNORMAL HIGH (ref 0–44)
AST: 46 U/L — ABNORMAL HIGH (ref 15–41)
Albumin: 2.8 g/dL — ABNORMAL LOW (ref 3.5–5.0)
Alkaline Phosphatase: 134 U/L — ABNORMAL HIGH (ref 38–126)
Anion gap: 8 (ref 5–15)
BUN: <5 mg/dL — ABNORMAL LOW (ref 6–20)
CO2: 24 mmol/L (ref 22–32)
Calcium: 8.3 mg/dL — ABNORMAL LOW (ref 8.9–10.3)
Chloride: 108 mmol/L (ref 98–111)
Creatinine, Ser: 0.51 mg/dL (ref 0.44–1.00)
GFR calc Af Amer: >60 mL/min (ref >60)
GFR calc non Af Amer: >60 mL/min (ref >60)
Glucose, Bld: 185 mg/dL — ABNORMAL HIGH (ref 70–99)
Potassium: 3.8 mmol/L (ref 3.5–5.1)
Sodium: 140 mmol/L (ref 135–145)
Total Bilirubin: 0.7 mg/dL (ref 0.3–1.2)
Total Protein: 6.5 g/dL (ref 6.5–8.1)

## 2019-07-11 LAB — PHOSPHORUS: Phosphorus: 2.1 mg/dL — ABNORMAL LOW (ref 2.5–4.6)

## 2019-07-11 LAB — LIPASE, BLOOD: Lipase: 20 U/L (ref 11–51)

## 2019-07-11 LAB — MAGNESIUM: Magnesium: 2.1 mg/dL (ref 1.7–2.4)

## 2019-07-11 MED ORDER — AMOXICILLIN-POT CLAVULANATE 875-125 MG PO TABS: 1.0000 | ORAL_TABLET | Freq: Two times a day (BID) | ORAL | 0 refills | Status: AC

## 2019-07-11 MED ORDER — IBUPROFEN 600 MG PO TABS: 600.0000 mg | ORAL_TABLET | Freq: Four times a day (QID) | ORAL | 0 refills | Status: DC | PRN

## 2019-07-11 MED ORDER — OXYCODONE HCL 5 MG PO TABS: 5.0000 mg | ORAL_TABLET | Freq: Four times a day (QID) | ORAL | 0 refills | Status: DC | PRN

## 2019-07-11 MED ORDER — SODIUM CHLORIDE 0.9 % IV SOLN
INTRAVENOUS | Status: DC | PRN
  Administered 2019-07-11: 250 mL via INTRAVENOUS

## 2019-07-11 NOTE — Discharge Instructions (Signed)
In addition to included general post-operative instructions for laparoscopic cholecystectomy,  Diet: Resume home diet.   Activity: No heavy lifting >20 pounds (children, pets, laundry, garbage) for about 4 weeks, but light activity and walking are encouraged. Do not drive or drink alcohol if taking narcotic pain medications or having pain that might distract from driving.  Wound care: 2 days after surgery (12/22), you may shower/get incision wet with soapy water and pat dry (do not rub incisions), but no baths or submerging incision underwater until follow-up.   Medications: Resume all home medications. For mild to moderate pain: acetaminophen (Tylenol) or ibuprofen/naproxen (if no kidney disease). Combining Tylenol with alcohol can substantially increase your risk of causing liver disease. Narcotic pain medications, if prescribed, can be used for severe pain, though may cause nausea, constipation, and drowsiness. Do not combine Tylenol and Percocet (or similar) within a 6 hour period as Percocet (and similar) contain(s) Tylenol. If you do not need the narcotic pain medication, you do not need to fill the prescription.  Call office [phone] / [phone]) at any time if any questions, worsening pain, fevers/chills, bleeding, drainage from incision site, or other concerns.

## 2019-07-11 NOTE — Progress Notes (Addendum)
[facility] Day(s): 3.   Post op day(s): 1 Day Post-Op.   Interval History:  Patient seen and examined no acute events or new complaints overnight.  Patient reports she had an episode of pain overnight but this improved with pain medications No fever, chills, nausea, or emesis Hyperbilirubinemia resolve, leukocytosis resolved She has tolerated CLD without issue No other new complaints.    Vital signs in last 24 hours: [min-max] current  Temp:  [97.8 F (36.6 C)-98.8 F (37.1 C)] 98.6 F (37 C) (12/21 0559) Pulse Rate:  [55-106] 61 (12/21 0611) Resp:  [12-31] 18 (12/21 0559) BP: (99-129)/(50-68) 104/61 (12/21 0611) SpO2:  [94 %-100 %] 98 % (12/21 0611)     Height: 5\' 5"  (165.1 cm) Weight: 81.6 kg BMI (Calculated): 29.95   Intake/Output last 2 shifts:  12/20 0701 - 12/21 0700 In: 1556.5 [P.O.:50; I.V.:984.7; IV Piggyback:521.8] Out: 670 [Urine:650; Blood:20]   Physical Exam:  Constitutional: alert, cooperative and no distress  Respiratory: breathing non-labored at rest  Cardiovascular: regular rate and sinus rhythm  Gastrointestinal: soft, incisional soreness, and non-distended. No rebound/guarding Integumentary: Laparoscopic Incisions are CDI with steri-strips, no erythema or drainage  Labs:  CBC Latest Ref Rng & Units 07/11/2019 07/10/2019 07/09/2019  WBC 4.0 - 10.5 K/uL 8.8 13.0(H) 10.1  Hemoglobin 12.0 - 15.0 g/dL 9.4(L) 10.4(L) 10.4(L)  Hematocrit 36.0 - 46.0 % 30.1(L) 32.0(L) 33.2(L)  Platelets 150 - 400 K/uL 283 283 302   CMP Latest Ref Rng & Units 07/11/2019 07/10/2019 07/09/2019  Glucose 70 - 99 mg/dL 185(H) 92 94  BUN 6 - 20 mg/dL <5(L) <5(L) 7  Creatinine 0.44 - 1.00 mg/dL 0.51 0.56 0.60  Sodium 135 - 145 mmol/L 140 134(L) 134(L)  Potassium 3.5 - 5.1 mmol/L 3.8 3.2(L) 3.6  Chloride 98 - 111 mmol/L 108 103 103  CO2 22 - 32 mmol/L 24 21(L) 21(L)  Calcium 8.9 - 10.3 mg/dL 8.3(L) 8.3(L) 8.4(L)  Total Protein  6.5 - 8.1 g/dL 6.5 7.0 6.7  Total Bilirubin 0.3 - 1.2 mg/dL 0.7 1.5(H) 1.8(H)  Alkaline Phos 38 - 126 U/L 134(H) 92 105  AST 15 - 41 U/L 46(H) 29 50(H)  ALT 0 - 44 U/L 80(H) 75(H) 104(H)     Imaging studies: No new pertinent imaging studies   Assessment/Plan: 43 y.o. female overall doing well with expected post-surgical soreness and resolution in leukocytosis and hyperbilirubinemia 1 Day Post-Op s/p laparoscopic cholecystectomy with IOC for gallstone pancreatitis.   - Advance diet as tolerates; she is clear for discharge from surgery standpoint, recommend complete 7 days ABx (Augmentin) and pain control, reviewed post-op care, follow up in 2 weeks with surgery  All of the above findings and recommendations were discussed with the patient, and the medical team, and all of patient's questions were answered to her expressed satisfaction.  -- Edison Simon, PA-C [facility] 07/11/2019, 7:32 AM [phone] M-F: 7am - 4pm  I saw and evaluated the patient.  I agree with the above documentation, exam, and plan, which I have edited where appropriate. Fredirick Maudlin  9:30 AM

## 2019-07-11 NOTE — Discharge Summary (Signed)
[place] [facility] [place] NAME: Ann Stevens    MR#:  [id number]  DATE OF BIRTH:  20-Apr-1976  DATE OF ADMISSION:  07/08/2019 ADMITTING PHYSICIAN: Elwyn Reach, MD  DATE OF DISCHARGE: 07/11/2019  PRIMARY CARE PHYSICIAN: Burnard Hawthorne, FNP    ADMISSION DIAGNOSIS:  Gallstones [K80.20] Pancreatitis, gallstone [K85.10] Abdominal pain [R10.9] Abdominal pain, unspecified abdominal location [R10.9] Acute pancreatitis, unspecified complication status, unspecified pancreatitis type [K85.90]  DISCHARGE DIAGNOSIS:  gallstone pancreatitis-- resolved acute cholecystitis with gallstones-- status post laparoscopic cholecystectomy  SECONDARY DIAGNOSIS:   Past Medical History:  Diagnosis Date  . Anemia, iron deficiency     HOSPITAL COURSE:  Haset Monachino a 43 y.o.femalewith medical history significant ofiron deficiency anemia who presents to the ER with sudden onset of right upper quadrant abdominal pain in the last 2 days. She had similar symptoms 2 months ago but resolved spontaneously. But this time around pain was as high as 9 out of 10  #1 acute gallstone pancreatitis: - Received IV fluids with Lactate Ringers -IV and po prn pain management  -Prn Zofran for nausea vomiting -empiric IV Zosyn--oral augmentin -lipase trending down 2287-- 324--20 -bilirubin and LFTs trending down -patient overall feels better   #2 acute cholecystitis:  -Secondary to gallstones.  -seen by surgery s/p lap cholecystectomy POD#1 -intraoperative cholangiogram negative for CBD stone -per G.I. no indication for ERCP  #3 iron deficiency anemia:Continue monitoring H&H.  Patient tolerating clear liquid. Diet advance to soft. follow-up with surgery as outpatient. Patient will be discharged.  DVT prophylaxis:Lovenox Code Status:Full code Family Communication:No family at bedside Disposition Plan:Home Consults called:gastroenterology  and Surgery  Admission status:Inpatient  CONSULTS OBTAINED:  Treatment Team:  Fredirick Maudlin, MD  DRUG ALLERGIES:  No Known Allergies  DISCHARGE MEDICATIONS:   Allergies as of 07/11/2019   No Known Allergies     Medication List    TAKE these medications   amoxicillin-clavulanate 875-125 MG tablet Commonly known as: Augmentin Take 1 tablet by mouth 2 (two) times daily for 5 days.   dicyclomine 10 MG capsule Commonly known as: BENTYL Take 1 capsule (10 mg total) by mouth 4 (four) times daily -  before meals and at bedtime. As needed for pain   ferrous sulfate 325 (65 FE) MG EC tablet Take 1 tablet (325 mg total) by mouth 2 (two) times daily.   ibuprofen 600 MG tablet Commonly known as: ADVIL Take 1 tablet (600 mg total) by mouth every 6 (six) hours as needed for headache or moderate pain.   Mili 0.25-35 MG-MCG tablet Generic drug: norgestimate-ethinyl estradiol Take 1 tablet by mouth daily.   oxyCODONE 5 MG immediate release tablet Commonly known as: Oxy IR/ROXICODONE Take 1 tablet (5 mg total) by mouth every 6 (six) hours as needed for severe pain.   pantoprazole 40 MG tablet Commonly known as: PROTONIX Take 1 tablet (40 mg total) by mouth daily.       If you experience worsening of your admission symptoms, develop shortness of breath, life threatening emergency, suicidal or homicidal thoughts you must seek medical attention immediately by calling 911 or calling your MD immediately  if symptoms less severe.  You Must read complete instructions/literature along with all the possible adverse reactions/side effects for all the Medicines you take and that have been prescribed to you. Take any new Medicines after you have completely understood and accept all the possible adverse reactions/side effects.   Please note  You were cared for by  a hospitalist during your hospital stay. If you have any questions about your discharge medications or the care you received  while you were in the hospital after you are discharged, you can call the unit and asked to speak with the hospitalist on call if the hospitalist that took care of you is not available. Once you are discharged, your primary care physician will handle any further medical issues. Please note that NO REFILLS for any discharge medications will be authorized once you are discharged, as it is imperative that you return to your primary care physician (or establish a relationship with a primary care physician if you do not have one) for your aftercare needs so that they can reassess your need for medications and monitor your lab values. Today   SUBJECTIVE   Postop day one. Some soreness on the abdomen. Tolerating liquid diet.  VITAL SIGNS:  Blood pressure 104/61, pulse 61, temperature 98.6 F (37 C), temperature source Oral, resp. rate 18, height 5\' 5"  (1.651 m), weight 81.6 kg, SpO2 98 %.  I/O:    Intake/Output Summary (Last 24 hours) at 07/11/2019 0908 Last data filed at 07/11/2019 0603 Gross per 24 hour  Intake 1556.52 ml  Output 370 ml  Net 1186.52 ml    PHYSICAL EXAMINATION:  GENERAL:  43 y.o.-year-old patient lying in the bed with no acute distress.  EYES: Pupils equal, round, reactive to light and accommodation. No scleral icterus. Extraocular muscles intact.  HEENT: Head atraumatic, normocephalic. Oropharynx and nasopharynx clear.  NECK:  Supple, no jugular venous distention. No thyroid enlargement, no tenderness.  LUNGS: Normal breath sounds bilaterally, no wheezing, rales,rhonchi or crepitation. No use of accessory muscles of respiration.  CARDIOVASCULAR: S1, S2 normal. No murmurs, rubs, or gallops.  ABDOMEN: Soft, non-tender, non-distended. Bowel sounds present. No organomegaly or mass. Surgical incisions healing well EXTREMITIES: No pedal edema, cyanosis, or clubbing.  NEUROLOGIC: Cranial nerves II through XII are intact. Muscle strength 5/5 in all extremities. Sensation intact.  Gait not checked.  PSYCHIATRIC: The patient is alert and oriented x 3.  SKIN: No obvious rash, lesion, or ulcer.   DATA REVIEW:   CBC  Recent Labs  Lab 07/11/19 0516  WBC 8.8  HGB 9.4*  HCT 30.1*  PLT 283    Chemistries  Recent Labs  Lab 07/11/19 0516  NA 140  K 3.8  CL 108  CO2 24  GLUCOSE 185*  BUN <5*  CREATININE 0.51  CALCIUM 8.3*  MG 2.1  AST 46*  ALT 80*  ALKPHOS 134*  BILITOT 0.7    Microbiology Results   Recent Results (from the past 240 hour(s))  Respiratory Panel by RT PCR (Flu A&B, Covid) - Nasopharyngeal Swab     Status: None   Collection Time: 07/08/19  2:19 PM   Specimen: Nasopharyngeal Swab  Result Value Ref Range Status   SARS Coronavirus 2 by RT PCR NEGATIVE NEGATIVE Final    Comment: (NOTE) SARS-CoV-2 target nucleic acids are NOT DETECTED. The SARS-CoV-2 RNA is generally detectable in upper respiratoy specimens during the acute phase of infection. The lowest concentration of SARS-CoV-2 viral copies this assay can detect is 131 copies/mL. A negative result does not preclude SARS-Cov-2 infection and should not be used as the sole basis for treatment or other patient management decisions. A negative result may occur with  improper specimen collection/handling, submission of specimen other than nasopharyngeal swab, presence of viral mutation(s) within the areas targeted by this assay, and inadequate number of viral copies (<131  copies/mL). A negative result must be combined with clinical observations, patient history, and epidemiological information. The expected result is Negative. Fact Sheet for Patients:  PinkCheek.be Fact Sheet for Healthcare Providers:  GravelBags.it This test is not yet ap proved or cleared by the [place] FDA and  has been authorized for detection and/or diagnosis of SARS-CoV-2 by FDA under an Emergency Use Authorization (EUA). This EUA will remain  in  effect (meaning this test can be used) for the duration of the COVID-19 declaration under Section 564(b)(1) of the Act, 21 U.S.C. section 360bbb-3(b)(1), unless the authorization is terminated or revoked sooner.    Influenza A by PCR NEGATIVE NEGATIVE Final   Influenza B by PCR NEGATIVE NEGATIVE Final    Comment: (NOTE) The Xpert Xpress SARS-CoV-2/FLU/RSV assay is intended as an aid in  the diagnosis of influenza from Nasopharyngeal swab specimens and  should not be used as a sole basis for treatment. Nasal washings and  aspirates are unacceptable for Xpert Xpress SARS-CoV-2/FLU/RSV  testing. Fact Sheet for Patients: PinkCheek.be Fact Sheet for Healthcare Providers: GravelBags.it This test is not yet approved or cleared by the [place] FDA and  has been authorized for detection and/or diagnosis of SARS-CoV-2 by  FDA under an Emergency Use Authorization (EUA). This EUA will remain  in effect (meaning this test can be used) for the duration of the  Covid-19 declaration under Section 564(b)(1) of the Act, 21  U.S.C. section 360bbb-3(b)(1), unless the authorization is  terminated or revoked. Performed at [facility], [address], [place], [place] [address]   Surgical pcr screen     Status: Abnormal   Collection Time: 07/10/19  6:08 AM   Specimen: Nasal Mucosa; Nasal Swab  Result Value Ref Range Status   MRSA, PCR NEGATIVE NEGATIVE Final   Staphylococcus aureus POSITIVE (A) NEGATIVE Final    Comment: (NOTE) The Xpert SA Assay (FDA approved for NASAL specimens in patients 74 years of age and older), is one component of a comprehensive surveillance program. It is not intended to diagnose infection nor to guide or monitor treatment. Performed at [facility], [address] [place], [place] [address]     RADIOLOGY:  DG Cholangiogram Operative  Result Date: 07/10/2019 CLINICAL DATA:   43 year old female undergoing laparoscopic cholecystectomy EXAM: INTRAOPERATIVE CHOLANGIOGRAM TECHNIQUE: Cholangiographic images from the C-arm fluoroscopic device were submitted for interpretation post-operatively. Please see the procedural report for the amount of contrast and the fluoroscopy time utilized. COMPARISON:  MRI of the abdomen 07/08/2019 FINDINGS: Intraoperative cine clips are submitted for review. The images demonstrate cannulation of the cystic duct remanent and intraoperative cholangiogram. No evidence of biliary ductal dilatation. No focal stenosis, stricture or evidence of choledocholithiasis. The ampulla and duodenum are not included in the field of view. Patency of the ampulla cannot be confirmed radiographically. IMPRESSION: No focal abnormality within the imaged portion of the biliary tree at the time of intraoperative cholangiogram. The ampulla and duodenum were not included in the field of view. Electronically Signed   By: Jacqulynn Cadet M.D.   On: 07/10/2019 13:23     CODE STATUS:     Code Status Orders  (From admission, onward)         Start     Ordered   07/08/19 1943  Full code  Continuous     07/08/19 1942        Code Status History    This patient has a current code status but no historical code  status.   Advance Care Planning Activity       TOTAL TIME TAKING CARE OF THIS PATIENT: *40* minutes.    Fritzi Mandes M.D on 07/11/2019 at 9:08 AM  Between 7am to 6pm - Pager - [phone] After 6pm go to [email or web address] - password [email or web address]  [facility]    CC: Primary care physician; Burnard Hawthorne, FNP

## 2019-07-11 NOTE — Progress Notes (Signed)
07/11/2019 2:06 PM  Shon Millet to be D/C'd Home per MD order.  Discussed prescriptions and follow up appointments with the patient. Prescriptions given to patient, medication list explained in detail. Pt verbalized understanding.  Allergies as of 07/11/2019   No Known Allergies     Medication List    TAKE these medications   amoxicillin-clavulanate 875-125 MG tablet Commonly known as: Augmentin Take 1 tablet by mouth 2 (two) times daily for 5 days. Notes to patient: Before bed 07/11/19   dicyclomine 10 MG capsule Commonly known as: BENTYL Take 1 capsule (10 mg total) by mouth 4 (four) times daily -  before meals and at bedtime. As needed for pain Notes to patient: With evening meal 07/11/19   ferrous sulfate 325 (65 FE) MG EC tablet Take 1 tablet (325 mg total) by mouth 2 (two) times daily. Notes to patient: Before bed 07/11/19   ibuprofen 600 MG tablet Commonly known as: ADVIL Take 1 tablet (600 mg total) by mouth every 6 (six) hours as needed for headache or moderate pain. Notes to patient: As needed   Mili 0.25-35 MG-MCG tablet Generic drug: norgestimate-ethinyl estradiol Take 1 tablet by mouth daily. Notes to patient: Morning 07/12/19   oxyCODONE 5 MG immediate release tablet Commonly known as: Oxy IR/ROXICODONE Take 1 tablet (5 mg total) by mouth every 6 (six) hours as needed for severe pain. Notes to patient: As needed   pantoprazole 40 MG tablet Commonly known as: PROTONIX Take 1 tablet (40 mg total) by mouth daily. Notes to patient: Morning 07/12/19       Vitals:   07/11/19 0559 07/11/19 0611  BP: (!) 99/54 104/61  Pulse: (!) 55 61  Resp: 18   Temp: 98.6 F (37 C)   SpO2: 97% 98%    Skin clean, dry and intact without evidence of skin break down, no evidence of skin tears noted. IV catheter discontinued intact. Site without signs and symptoms of complications. Dressing and pressure applied. Pt denies pain at this time. No complaints noted.  An  After Visit Summary was printed and given to the patient. Patient escorted via [place], and D/C home via private auto.  Dola Argyle

## 2019-07-12 LAB — SURGICAL PATHOLOGY

## 2019-07-14 ENCOUNTER — Encounter: Admitting: Obstetrics and Gynecology

## 2019-07-20 ENCOUNTER — Encounter

## 2019-07-26 ENCOUNTER — Ambulatory Visit (INDEPENDENT_AMBULATORY_CARE_PROVIDER_SITE_OTHER): Payer: Self-pay | Admitting: General Surgery

## 2019-07-26 ENCOUNTER — Encounter: Payer: Self-pay | Admitting: General Surgery

## 2019-07-26 ENCOUNTER — Other Ambulatory Visit: Payer: Self-pay

## 2019-07-26 VITALS — BP 118/77 | HR 69 | Temp 97.7°F | Ht 64.0 in | Wt 183.0 lb

## 2019-07-26 DIAGNOSIS — Z9049 Acquired absence of other specified parts of digestive tract: Secondary | ICD-10-CM

## 2019-07-26 DIAGNOSIS — K851 Biliary acute pancreatitis without necrosis or infection: Secondary | ICD-10-CM

## 2019-07-26 NOTE — Progress Notes (Signed)
Ann Stevens is here today for a postoperative visit.  She is a 44 year old woman who was admitted to the hospital with gallstone pancreatitis.  She underwent an uncomplicated laparoscopic cholecystectomy with intraoperative cholangiogram showing no retained stones on July 10, 2019.  She did well postoperatively and was discharged home.  She states that since her operation, she has had some nausea associated with consuming fatty foods but otherwise is tolerating a general diet.  She denies any fevers or chills.  No vomiting.  No diarrhea.  Today's Vitals   07/26/19 1048  BP: 118/77  Pulse: 69  Temp: 97.7 F (36.5 C)  SpO2: 98%  Weight: 183 lb (83 kg)  Height: 5\' 4"  (1.626 m)   Body mass index is 31.41 kg/m.  Focused abdominal examination: The Steri-Strips are still intact on her laparoscopic port sites.  These were removed to reveal well approximated incisions.  The subxiphoid port has some skin separation with a small amount of serous drainage, but no erythema, induration, or purulent discharge is identified from any site.  Impression and plan: This is a 44 year old woman who had gallstone pancreatitis.  She underwent cholecystectomy without complications.  She is doing well.  She was advised to minimize the intake of fatty foods for the next few months while her body adjusts to the absence of her gallbladder.  She may resume all of her usual activities with the exception that she should continue to avoid lifting anything heavier than 10 pounds for a total of 3 weeks from the time of her operation.  We will see her on an as-needed basis.

## 2019-07-26 NOTE — Patient Instructions (Signed)
Follow-up with our office as needed.  Please call and ask to speak with a nurse if you develop questions or concerns.   GENERAL POST-OPERATIVE PATIENT INSTRUCTIONS   DIET:  You may eat any foods that you can tolerate.  It is a good idea to eat a high fiber diet and take in plenty of fluids to prevent constipation.  If you do become constipated you may want to take a mild laxative or take ducolax tablets on a daily basis until your bowel habits are regular.  Constipation can be very uncomfortable, along with straining, after recent surgery.  ACTIVITY:  You are encouraged to cough and deep breath or use your incentive spirometer if you were given one, every 15-30 minutes when awake.  This will help prevent respiratory complications and low grade fevers post-operatively if you had a general anesthetic.  You may want to hug a pillow when coughing and sneezing to add additional support to the surgical area, if you had abdominal or chest surgery, which will decrease pain during these times.  You are encouraged to walk and engage in light activity for the next two weeks.  You should not lift more than 20 pounds for 6 weeks after surgery as it could put you at increased risk for complications.  Twenty pounds is roughly equivalent to a plastic bag of groceries. At that time- Listen to your body when lifting, if you have pain when lifting, stop and then try again in a few days. Soreness after doing exercises or activities of daily living is normal as you get back in to your normal routine.  MEDICATIONS:  Try to take narcotic medications and anti-inflammatory medications, such as tylenol, ibuprofen, naprosyn, etc., with food.  This will minimize stomach upset from the medication.  Should you develop nausea and vomiting from the pain medication, or develop a rash, please discontinue the medication and contact your physician.  You should not drive, make important decisions, or operate machinery when taking narcotic  pain medication.  SUNBLOCK Use sun block to incision area over the next year if this area will be exposed to sun. This helps decrease scarring and will allow you avoid a permanent darkened area over your incision.  QUESTIONS:  Please feel free to call our office if you have any questions, and we will be glad to assist you.

## 2019-07-27 ENCOUNTER — Ambulatory Visit: Admitting: Family

## 2019-08-03 ENCOUNTER — Ambulatory Visit: Admitting: Family

## 2019-08-11 ENCOUNTER — Other Ambulatory Visit (HOSPITAL_COMMUNITY)
Admission: RE | Admit: 2019-08-11 | Discharge: 2019-08-11 | Disposition: A | Discharge status: Home / Self Care | Source: Ambulatory Visit | Attending: Obstetrics and Gynecology | Admitting: Obstetrics and Gynecology

## 2019-08-11 ENCOUNTER — Ambulatory Visit (INDEPENDENT_AMBULATORY_CARE_PROVIDER_SITE_OTHER): Admitting: Obstetrics and Gynecology

## 2019-08-11 ENCOUNTER — Encounter: Payer: Self-pay | Admitting: Obstetrics and Gynecology

## 2019-08-11 ENCOUNTER — Other Ambulatory Visit: Payer: Self-pay

## 2019-08-11 VITALS — BP 120/70 | Ht 64.0 in | Wt 183.0 lb

## 2019-08-11 DIAGNOSIS — N939 Abnormal uterine and vaginal bleeding, unspecified: Secondary | ICD-10-CM

## 2019-08-11 DIAGNOSIS — Z124 Encounter for screening for malignant neoplasm of cervix: Secondary | ICD-10-CM | POA: Insufficient documentation

## 2019-08-11 DIAGNOSIS — N938 Other specified abnormal uterine and vaginal bleeding: Secondary | ICD-10-CM | POA: Diagnosis not present

## 2019-08-11 DIAGNOSIS — N926 Irregular menstruation, unspecified: Secondary | ICD-10-CM

## 2019-08-11 DIAGNOSIS — N852 Hypertrophy of uterus: Secondary | ICD-10-CM | POA: Diagnosis not present

## 2019-08-11 DIAGNOSIS — Z1231 Encounter for screening mammogram for malignant neoplasm of breast: Secondary | ICD-10-CM

## 2019-08-11 DIAGNOSIS — N921 Excessive and frequent menstruation with irregular cycle: Secondary | ICD-10-CM

## 2019-08-11 MED ORDER — NAPROXEN 500 MG PO TABS: 500.0000 mg | ORAL_TABLET | Freq: Two times a day (BID) | ORAL | 2 refills | Status: DC

## 2019-08-11 NOTE — Progress Notes (Signed)
Patient ID: Ann Stevens, female   DOB: [birth year], 44 y.o.   MRN: [id number]  Reason for Consult: Referral (Heavy periods and some clotting, questions about iron pills )   Referred by Jonathon Bellows, MD  Subjective:     HPI:  Ann Stevens is a 44 y.o. female She presents today for concerns regarding irregular and heavy menstrual bleeding.   Gynecological History Menarche: 89 to 44 years old Menopause: Not applicable LMP: [id number] Describes periods as irregular having about 6 periods a year. Roughly 6 days between periods.  She reports 5 to 6 days of heavy bleeding.  She has gushing and flooding sensations.  She reports large clots that pop into the toilet.  She has to frequently change her pads and tampons.  She has missed work 1 or 2 times because of the heavy bleeding.  She is having increasingly painful cramps not controlled by Tylenol or ibuprofen.  She does not have a history of fibroids polyps or ovarian cyst.  She is taking an OCP which is helping her periods be more regular., but they are still heavy.  Last pap smear: unknown Last Mammogram: unknown  Obstetrical History History of 2 full term cesarean sections, uncomplicated births.    Past Medical History:  Diagnosis Date  . Anemia, iron deficiency    History reviewed. No pertinent family history. Past Surgical History:  Procedure Laterality Date  . CESAREAN SECTION  12/05/94,05/26/02  . CHOLECYSTECTOMY N/A 07/10/2019   Procedure: LAPAROSCOPIC CHOLECYSTECTOMY WITH INTRAOPERATIVE CHOLANGIOGRAM;  Surgeon: Fredirick Maudlin, MD;  Location: [facility] ORS;  Service: General;  Laterality: N/A;    Short Social History:  Social History   Tobacco Use  . Smoking status: Never Smoker  . Smokeless tobacco: Never Used  Substance Use Topics  . Alcohol use: Yes    Comment: occ    No Known Allergies  Current Outpatient Medications  Medication Sig Dispense Refill  . ferrous sulfate 325 (65 FE) MG EC tablet Take 1 tablet  (325 mg total) by mouth 2 (two) times daily. 180 tablet 1  . norgestimate-ethinyl estradiol (MILI) 0.25-35 MG-MCG tablet Take 1 tablet by mouth daily.    . pantoprazole (PROTONIX) 40 MG tablet Take 1 tablet (40 mg total) by mouth daily. 90 tablet 3  . dicyclomine (BENTYL) 10 MG capsule Take 1 capsule (10 mg total) by mouth 4 (four) times daily -  before meals and at bedtime. As needed for pain (Patient not taking: Reported on 08/11/2019) 120 capsule 2  . ibuprofen (ADVIL) 600 MG tablet Take 1 tablet (600 mg total) by mouth every 6 (six) hours as needed for headache or moderate pain. (Patient not taking: Reported on 08/11/2019) 30 tablet 0   No current facility-administered medications for this visit.    Review of Systems  Constitutional: Negative for chills, fatigue, fever and unexpected weight change.  HENT: Negative for trouble swallowing.  Eyes: Negative for loss of vision.  Respiratory: Negative for cough, shortness of breath and wheezing.  Cardiovascular: Negative for chest pain, leg swelling, palpitations and syncope.  GI: Negative for abdominal pain, blood in stool, diarrhea, nausea and vomiting.  GU: Negative for difficulty urinating, dysuria, frequency and hematuria.  Musculoskeletal: Negative for back pain, leg pain and joint pain.  Skin: Negative for rash.  Neurological: Negative for dizziness, headaches, light-headedness, numbness and seizures.  Psychiatric: Negative for behavioral problem, confusion, depressed mood and sleep disturbance.        Objective:  Objective   Vitals:   08/11/19  0918  BP: 120/70  Weight: 183 lb (83 kg)  Height: 5\' 4"  (1.626 m)   Body mass index is 31.41 kg/m.  Physical Exam Vitals and nursing note reviewed.  Constitutional:      Appearance: She is well-developed.  HENT:     Head: Normocephalic and atraumatic.  Eyes:     Pupils: Pupils are equal, round, and reactive to light.  Cardiovascular:     Rate and Rhythm: Normal rate and regular  rhythm.  Pulmonary:     Effort: Pulmonary effort is normal. No respiratory distress.  Genitourinary:    Comments: External: Normal appearing vulva. No lesions noted.  Speculum examination: Normal appearing cervix. No blood in the vaginal vault.  No discharge.   Bimanual examination: Uterus enlarged, 12cm in size. Midline, non-tender, irregular contour.  No CMT. No adnexal masses. No adnexal tenderness. Pelvis not fixed.  Skin:    General: Skin is warm and dry.  Neurological:     Mental Status: She is alert and oriented to person, place, and time.  Psychiatric:        Behavior: Behavior normal.        Thought Content: Thought content normal.        Judgment: Judgment normal.      Assessment/Plan:     44 yo with irregular and heavy menstrual bleeding.  Bulky enlarged uterus on exam 12 cm approximately- likely fibroid uterus, follow up for pelvic US.  Continue OCP. Start NSAID during period.  Mammogram ordered. Pap ordered. Follow up in 2 weeks.   Adrian Prows MD [facility] 08/11/2019 9:56 AM

## 2019-08-11 NOTE — Patient Instructions (Signed)

## 2019-08-16 LAB — CYTOLOGY - PAP
Comment: NEGATIVE
Diagnosis: NEGATIVE
Diagnosis: REACTIVE
High risk HPV: NEGATIVE

## 2019-08-17 ENCOUNTER — Ambulatory Visit (INDEPENDENT_AMBULATORY_CARE_PROVIDER_SITE_OTHER): Admitting: Gastroenterology

## 2019-08-17 ENCOUNTER — Encounter: Payer: Self-pay | Admitting: Gastroenterology

## 2019-08-17 ENCOUNTER — Other Ambulatory Visit: Payer: Self-pay

## 2019-08-17 VITALS — BP 119/80 | HR 76 | Temp 99.0°F

## 2019-08-17 DIAGNOSIS — R7989 Other specified abnormal findings of blood chemistry: Secondary | ICD-10-CM

## 2019-08-17 DIAGNOSIS — G8929 Other chronic pain: Secondary | ICD-10-CM

## 2019-08-17 DIAGNOSIS — R1011 Right upper quadrant pain: Secondary | ICD-10-CM

## 2019-08-17 DIAGNOSIS — R945 Abnormal results of liver function studies: Secondary | ICD-10-CM

## 2019-08-17 NOTE — Progress Notes (Signed)
Jonathon Bellows MD, MRCP(U.K) [address]  [place]  [place], [place] [address]  Main: [phone]  Fax: [phone]   Primary Care Physician: Burnard Hawthorne, FNP  Primary Gastroenterologist:  Dr. Jonathon Bellows   No chief complaint on file.   HPI: Ann Stevens is a 44 y.o. female    Summary of history :  Last seen by office in November 2020 for abdominal pain which at that time was not very typical of biliary colic but she was being evaluated for the same and was awaiting a HIDA scan when she got admitted to the hospital.  In addition at that time I noted she had microcytic anemia which is iron deficiency and felt it was secondary to menorrhagia and was referred to GYN.   Interval history 06/14/2019-08/17/2019  06/14/2019: B12 and folate normal. At the end of her last office visit the plan was to obtain a HIDA scan if she did not feel better in a couple of weeks and in the interim she presented to the hospital in December 2020 with choledocholithiasis and gallstone pancreatitis.  Choledocholithiasis was confirmed on MRCP.  She was also treated for acute cholecystitis.  Underwent a cholecystectomy and no stones were seen in the common bile duct on intraoperative cholangiogram and hence the ERCP was canceled.   She has been seen by Dr. Nechama Guard for menorrhagia. Labs in November 2020 demonstrate an iron deficiency .  She is on iron tablets and being evaluated for uterine fibroids   Current Outpatient Medications  Medication Sig Dispense Refill  . dicyclomine (BENTYL) 10 MG capsule Take 1 capsule (10 mg total) by mouth 4 (four) times daily -  before meals and at bedtime. As needed for pain (Patient not taking: Reported on 08/11/2019) 120 capsule 2  . ferrous sulfate 325 (65 FE) MG EC tablet Take 1 tablet (325 mg total) by mouth 2 (two) times daily. 180 tablet 1  . ibuprofen (ADVIL) 600 MG tablet Take 1 tablet (600 mg total) by mouth every 6 (six) hours as needed for headache  or moderate pain. (Patient not taking: Reported on 08/11/2019) 30 tablet 0  . naproxen (NAPROSYN) 500 MG tablet Take 1 tablet (500 mg total) by mouth 2 (two) times daily with a meal. As needed for pain 60 tablet 2  . norgestimate-ethinyl estradiol (MILI) 0.25-35 MG-MCG tablet Take 1 tablet by mouth daily.    . pantoprazole (PROTONIX) 40 MG tablet Take 1 tablet (40 mg total) by mouth daily. 90 tablet 3   No current facility-administered medications for this visit.    Allergies as of 08/17/2019  . (No Known Allergies)    ROS:  General: Negative for anorexia, weight loss, fever, chills, fatigue, weakness. ENT: Negative for hoarseness, difficulty swallowing , nasal congestion. CV: Negative for chest pain, angina, palpitations, dyspnea on exertion, peripheral edema.  Respiratory: Negative for dyspnea at rest, dyspnea on exertion, cough, sputum, wheezing.  GI: See history of present illness. GU:  Negative for dysuria, hematuria, urinary incontinence, urinary frequency, nocturnal urination.  Endo: Negative for unusual weight change.    Physical Examination:   There were no vitals taken for this visit.  General: Well-nourished, well-developed in no acute distress.  Eyes: No icterus. Conjunctivae pink. Neuro: Alert and oriented x 3.  Grossly intact. Psych: Alert and cooperative, normal mood and affect.   Imaging Studies: No results found.  Assessment and Plan:   Ann Stevens is a 44 y.o. y/o female here to follow-up after recent hospital  admission when she was admitted for gallstone pancreatitis and choledocholithiasis.  Status post cholecystectomy.  Intraoperative cholangiogram showed no stones in the common bile duct.  She is doing well postop.  No abdominal pain.  She follows with Dr. Nechama Guard for menorrhagia and iron deficiency secondary to blood loss.  She is on iron tablets.  Plan 1.  Check LFTs to ensure they are have resolved back to normal  Dr Jonathon Bellows  MD,MRCP  [facility]) Follow up in as needed

## 2019-08-18 LAB — HEPATIC FUNCTION PANEL
ALT: 9 IU/L (ref 0–32)
AST: 16 IU/L (ref 0–40)
Albumin: 4.1 g/dL (ref 3.8–4.8)
Alkaline Phosphatase: 85 IU/L (ref 39–117)
Bilirubin Total: <0.2 mg/dL (ref 0.0–1.2)
Bilirubin, Direct: 0.1 mg/dL (ref 0.00–0.40)
Total Protein: 7.2 g/dL (ref 6.0–8.5)

## 2019-08-21 ENCOUNTER — Encounter: Payer: Self-pay | Admitting: Gastroenterology

## 2019-08-23 ENCOUNTER — Telehealth: Payer: Self-pay

## 2019-08-23 NOTE — Telephone Encounter (Signed)
Called pt to inform her of lab results.  Unable to contact. Left detailed VM

## 2019-08-29 ENCOUNTER — Encounter: Payer: Self-pay | Admitting: Obstetrics and Gynecology

## 2019-09-07 ENCOUNTER — Ambulatory Visit (INDEPENDENT_AMBULATORY_CARE_PROVIDER_SITE_OTHER)

## 2019-09-07 ENCOUNTER — Ambulatory Visit (INDEPENDENT_AMBULATORY_CARE_PROVIDER_SITE_OTHER): Admitting: Obstetrics and Gynecology

## 2019-09-07 ENCOUNTER — Other Ambulatory Visit: Payer: Self-pay

## 2019-09-07 ENCOUNTER — Encounter: Payer: Self-pay | Admitting: Obstetrics and Gynecology

## 2019-09-07 VITALS — BP 100/64 | Ht 64.0 in | Wt 186.0 lb

## 2019-09-07 DIAGNOSIS — N921 Excessive and frequent menstruation with irregular cycle: Secondary | ICD-10-CM

## 2019-09-07 DIAGNOSIS — N939 Abnormal uterine and vaginal bleeding, unspecified: Secondary | ICD-10-CM

## 2019-09-07 DIAGNOSIS — N925 Other specified irregular menstruation: Secondary | ICD-10-CM

## 2019-09-07 DIAGNOSIS — N938 Other specified abnormal uterine and vaginal bleeding: Secondary | ICD-10-CM

## 2019-09-07 DIAGNOSIS — Z1231 Encounter for screening mammogram for malignant neoplasm of breast: Secondary | ICD-10-CM

## 2019-09-07 DIAGNOSIS — N926 Irregular menstruation, unspecified: Secondary | ICD-10-CM

## 2019-09-07 NOTE — Progress Notes (Signed)
Patient ID: Ann Stevens, female   DOB: 12-31-1975, 44 y.o.   MRN: [id number]  Reason for Consult: Follow-up (U/S follow up )   Referred by Burnard Hawthorne, FNP  Subjective:     HPI:  Ann Stevens is a 44 y.o. female she is following up today for a pelvic ultrasound.  She used naproxen with her most recent menstrual period and had a significant improvement in her vaginal bleeding.  She is very happy with this medication.   Past Medical History:  Diagnosis Date  . Anemia, iron deficiency    History reviewed. No pertinent family history. Past Surgical History:  Procedure Laterality Date  . CESAREAN SECTION  12/05/94,05/26/02  . CHOLECYSTECTOMY N/A 07/10/2019   Procedure: LAPAROSCOPIC CHOLECYSTECTOMY WITH INTRAOPERATIVE CHOLANGIOGRAM;  Surgeon: Fredirick Maudlin, MD;  Location: [facility] ORS;  Service: General;  Laterality: N/A;    Short Social History:  Social History   Tobacco Use  . Smoking status: Never Smoker  . Smokeless tobacco: Never Used  Substance Use Topics  . Alcohol use: Yes    Comment: occ    No Known Allergies  Current Outpatient Medications  Medication Sig Dispense Refill  . ferrous sulfate 325 (65 FE) MG EC tablet Take 1 tablet (325 mg total) by mouth 2 (two) times daily. 180 tablet 1  . norgestimate-ethinyl estradiol (MILI) 0.25-35 MG-MCG tablet Take 1 tablet by mouth daily.    . pantoprazole (PROTONIX) 40 MG tablet Take 1 tablet (40 mg total) by mouth daily. 90 tablet 3  . dicyclomine (BENTYL) 10 MG capsule Take 1 capsule (10 mg total) by mouth 4 (four) times daily -  before meals and at bedtime. As needed for pain (Patient not taking: Reported on 09/07/2019) 120 capsule 2  . ibuprofen (ADVIL) 600 MG tablet Take 1 tablet (600 mg total) by mouth every 6 (six) hours as needed for headache or moderate pain. (Patient not taking: Reported on 09/07/2019) 30 tablet 0  . naproxen (NAPROSYN) 500 MG tablet Take 1 tablet (500 mg total) by mouth 2 (two) times daily  with a meal. As needed for pain (Patient not taking: Reported on 08/17/2019) 60 tablet 2   No current facility-administered medications for this visit.    Review of Systems  Constitutional: Negative for chills, fatigue, fever and unexpected weight change.  HENT: Negative for trouble swallowing.  Eyes: Negative for loss of vision.  Respiratory: Negative for cough, shortness of breath and wheezing.  Cardiovascular: Negative for chest pain, leg swelling, palpitations and syncope.  GI: Negative for abdominal pain, blood in stool, diarrhea, nausea and vomiting.  GU: Negative for difficulty urinating, dysuria, frequency and hematuria.  Musculoskeletal: Negative for back pain, leg pain and joint pain.  Skin: Negative for rash.  Neurological: Negative for dizziness, headaches, light-headedness, numbness and seizures.  Psychiatric: Negative for behavioral problem, confusion, depressed mood and sleep disturbance.        Objective:  Objective   Vitals:   09/07/19 0959  BP: 100/64  Weight: 186 lb (84.4 kg)  Height: 5\' 4"  (1.626 m)   Body mass index is 31.93 kg/m.  Physical Exam Vitals and nursing note reviewed.  Constitutional:      Appearance: She is well-developed.  HENT:     Head: Normocephalic and atraumatic.  Eyes:     Pupils: Pupils are equal, round, and reactive to light.  Cardiovascular:     Rate and Rhythm: Normal rate and regular rhythm.  Pulmonary:     Effort: Pulmonary effort is  normal. No respiratory distress.  Skin:    General: Skin is warm and dry.  Neurological:     Mental Status: She is alert and oriented to person, place, and time.  Psychiatric:        Behavior: Behavior normal.        Thought Content: Thought content normal.        Judgment: Judgment normal.         Assessment/Plan:     44 year old with irregular heavy menstrual periods periods currently using an oral contraceptive pill and naproxen. This combination has significantly improved her  menstrual periods and she is happy with the decrease in her menstrual flow.  Discussed the ultrasound results and reviewed the images with the patient.  No discrete fibroids seen however given the slightly enlarged uterine size and bulkiness on exam and the irregularity and heterogeneous appearance of the endometrium suspect adenomyosis.  Discussed that a pelvic MRI can be helpful in making this direct diagnosis although it is generally a definitive pathological diagnosis which could be made if she was ever to need a hysterectomy.  At this time we will continue current medication regimen with oral contraceptive pill and naproxen.  Mammogram reordered.  Follow-up in 1 year for annual exam.   More than 15 minutes were spent face to face with the patient in the room with more than 50% of the time spent providing counseling and discussing the plan of management.     Adrian Prows MD [facility], [facility] 09/07/2019 10:39 AM

## 2019-09-23 ENCOUNTER — Encounter: Payer: Self-pay | Admitting: Family

## 2019-09-23 ENCOUNTER — Ambulatory Visit (INDEPENDENT_AMBULATORY_CARE_PROVIDER_SITE_OTHER): Admitting: Family

## 2019-09-23 ENCOUNTER — Other Ambulatory Visit: Payer: Self-pay

## 2019-09-23 VITALS — BP 100/68 | HR 83 | Temp 97.2°F | Ht 64.0 in | Wt 186.4 lb

## 2019-09-23 DIAGNOSIS — D508 Other iron deficiency anemias: Secondary | ICD-10-CM | POA: Diagnosis not present

## 2019-09-23 DIAGNOSIS — Z0000 Encounter for general adult medical examination without abnormal findings: Secondary | ICD-10-CM | POA: Insufficient documentation

## 2019-09-23 DIAGNOSIS — Z7689 Persons encountering health services in other specified circumstances: Secondary | ICD-10-CM

## 2019-09-23 LAB — CBC WITH DIFFERENTIAL/PLATELET
Basophils Absolute: 0.1 10*3/uL (ref 0.0–0.1)
Basophils Relative: 1.1 % (ref 0.0–3.0)
Eosinophils Absolute: 0.3 10*3/uL (ref 0.0–0.7)
Eosinophils Relative: 4.5 % (ref 0.0–5.0)
HCT: 38.6 % (ref 36.0–46.0)
Hemoglobin: 12.6 g/dL (ref 12.0–15.0)
Lymphocytes Relative: 28.2 % (ref 12.0–46.0)
Lymphs Abs: 1.9 10*3/uL (ref 0.7–4.0)
MCHC: 32.7 g/dL (ref 30.0–36.0)
MCV: 83.2 fl (ref 78.0–100.0)
Monocytes Absolute: 0.3 10*3/uL (ref 0.1–1.0)
Monocytes Relative: 4.6 % (ref 3.0–12.0)
Neutro Abs: 4.1 10*3/uL (ref 1.4–7.7)
Neutrophils Relative %: 61.6 % (ref 43.0–77.0)
Platelets: 297 10*3/uL (ref 150.0–400.0)
RBC: 4.64 Mil/uL (ref 3.87–5.11)
RDW: 16.4 % — ABNORMAL HIGH (ref 11.5–15.5)
WBC: 6.6 10*3/uL (ref 4.0–10.5)

## 2019-09-23 LAB — COMPREHENSIVE METABOLIC PANEL
ALT: 11 U/L (ref 0–35)
AST: 12 U/L (ref 0–37)
Albumin: 4.1 g/dL (ref 3.5–5.2)
Alkaline Phosphatase: 67 U/L (ref 39–117)
BUN: 6 mg/dL (ref 6–23)
CO2: 27 mEq/L (ref 19–32)
Calcium: 9.8 mg/dL (ref 8.4–10.5)
Chloride: 104 mEq/L (ref 96–112)
Creatinine, Ser: 0.77 mg/dL (ref 0.40–1.20)
GFR: 98.69 mL/min (ref >60.00)
Glucose, Bld: 98 mg/dL (ref 70–99)
Potassium: 4.3 mEq/L (ref 3.5–5.1)
Sodium: 139 mEq/L (ref 135–145)
Total Bilirubin: 0.2 mg/dL (ref 0.2–1.2)
Total Protein: 7.6 g/dL (ref 6.0–8.3)

## 2019-09-23 LAB — LIPID PANEL
Cholesterol: 213 mg/dL — ABNORMAL HIGH (ref 0–200)
HDL: 55.6 mg/dL (ref >39.00)
LDL Cholesterol: 120 mg/dL — ABNORMAL HIGH (ref 0–99)
NonHDL: 157.74
Total CHOL/HDL Ratio: 4
Triglycerides: 187 mg/dL — ABNORMAL HIGH (ref 0.0–149.0)
VLDL: 37.4 mg/dL (ref 0.0–40.0)

## 2019-09-23 LAB — VITAMIN D 25 HYDROXY (VIT D DEFICIENCY, FRACTURES): VITD: 29.26 ng/mL — ABNORMAL LOW (ref 30.00–100.00)

## 2019-09-23 LAB — IBC + FERRITIN
Ferritin: 17.9 ng/mL (ref 10.0–291.0)
Iron: 13 ug/dL — ABNORMAL LOW (ref 42–145)
Saturation Ratios: 3 % — ABNORMAL LOW (ref 20.0–50.0)
Transferrin: 308 mg/dL (ref 212.0–360.0)

## 2019-09-23 LAB — TSH: TSH: 1.22 u[IU]/mL (ref 0.35–4.50)

## 2019-09-23 LAB — HEMOGLOBIN A1C: Hgb A1c MFr Bld: 5.9 % (ref 4.6–6.5)

## 2019-09-23 MED ORDER — PANTOPRAZOLE SODIUM 20 MG PO TBEC: 20.0000 mg | DELAYED_RELEASE_TABLET | Freq: Every day | ORAL | 1 refills | Status: DC

## 2019-09-23 NOTE — Patient Instructions (Signed)
As discussed, I think it prudent to go ahead and decrease  Protonix.  Certainly if you have any rebound symptoms as we discussed let me know.  Please wean to protonix 20 mg and then every other day.  You may take this slowly.  You may certainly take the Protonix when you take the naproxen to protect stomach Follow-up in 3 months

## 2019-09-23 NOTE — Assessment & Plan Note (Addendum)
Related to menses, perhaps post op. Advised protonix 20mg  when takes naprosyn. Likely doesn't require daily Protonix in the absence of GERD symptoms

## 2019-09-23 NOTE — Assessment & Plan Note (Signed)
Reviewed past medical history today.  Screening labs ordered as discussed with patient

## 2019-09-23 NOTE — Progress Notes (Signed)
Subjective:    Patient ID: Ann Stevens, female    DOB: 1976-06-21, 44 y.o.   MRN: [id number]  CC: Ann Stevens is a 44 y.o. female who presents today to establish care.    HPI: Feels well today, no concerns.    She is been taking Protonix 40 mg for quite some time due to epigastric pain as she suspected she had acid reflux.  She thinks all along it with gallstones.  She denies dysphagia, regurgitation, dyspepsia.    Anemia/ IDA- no dizziness, fatigue. Taking iron BID most days with orange juice. No blood in stool.    Menses improved, not as heavy. Takes naprosyn just prior and during the menses.  Follows with OB/GYN, Dr. Nechama Guard for evaluation of uterine fibroids. Last visit 09/07/19.  Patient on OCP  and naproxen Follows with Dr. Vicente Males, gastroenterology.  Underwent cholecystectomy.     HISTORY:  Past Medical History:  Diagnosis Date  . Anemia, iron deficiency    Past Surgical History:  Procedure Laterality Date  . CESAREAN SECTION  12/05/94,05/26/02  . CHOLECYSTECTOMY N/A 07/10/2019   Procedure: LAPAROSCOPIC CHOLECYSTECTOMY WITH INTRAOPERATIVE CHOLANGIOGRAM;  Surgeon: Fredirick Maudlin, MD;  Location: [facility] ORS;  Service: General;  Laterality: N/A;   History reviewed. No pertinent family history.  Allergies: Patient has no known allergies. Current Outpatient Medications on File Prior to Visit  Medication Sig Dispense Refill  . ferrous sulfate 325 (65 FE) MG EC tablet Take 1 tablet (325 mg total) by mouth 2 (two) times daily. 180 tablet 1  . naproxen (NAPROSYN) 500 MG tablet Take 1 tablet (500 mg total) by mouth 2 (two) times daily with a meal. As needed for pain 60 tablet 2  . norgestimate-ethinyl estradiol (MILI) 0.25-35 MG-MCG tablet Take 1 tablet by mouth daily.     No current facility-administered medications on file prior to visit.    Social History   Tobacco Use  . Smoking status: Never Smoker  . Smokeless tobacco: Never Used  Substance Use Topics  . Alcohol  use: Yes    Comment: occ  . Drug use: Never    Review of Systems  Constitutional: Negative for chills and fever.  Respiratory: Negative for cough.   Cardiovascular: Negative for chest pain and palpitations.  Gastrointestinal: Negative for abdominal distention, abdominal pain, blood in stool, nausea and vomiting.  Genitourinary: Negative for menstrual problem (resolved), pelvic pain and vaginal bleeding.      Objective:    BP 100/68   Pulse 83   Temp (!) 97.2 F (36.2 C)   Ht 5\' 4"  (1.626 m)   Wt 186 lb 6.4 oz (84.6 kg)   LMP 08/25/2019 (Exact Date)   SpO2 98%   BMI 32.00 kg/m  BP Readings from Last 3 Encounters:  09/23/19 100/68  09/07/19 100/64  08/17/19 119/80   Wt Readings from Last 3 Encounters:  09/23/19 186 lb 6.4 oz (84.6 kg)  09/07/19 186 lb (84.4 kg)  08/11/19 183 lb (83 kg)    Physical Exam Vitals reviewed.  Constitutional:      Appearance: She is well-developed.  Eyes:     Conjunctiva/sclera: Conjunctivae normal.  Cardiovascular:     Rate and Rhythm: Normal rate and regular rhythm.     Pulses: Normal pulses.     Heart sounds: Normal heart sounds.  Pulmonary:     Effort: Pulmonary effort is normal.     Breath sounds: Normal breath sounds. No wheezing, rhonchi or rales.  Skin:    General: Skin  is warm and dry.  Neurological:     Mental Status: She is alert.  Psychiatric:        Speech: Speech normal.        Behavior: Behavior normal.        Thought Content: Thought content normal.        Assessment & Plan:   Problem List Items Addressed This Visit      Other   Encounter to establish care - Primary    Reviewed past medical history today.  Screening labs ordered as discussed with patient      Relevant Medications   pantoprazole (PROTONIX) 20 MG tablet   Other Relevant Orders   CBC with Differential/Platelet   IBC + Ferritin   Lipid panel   Hemoglobin A1c   TSH   Comprehensive metabolic panel   VITAMIN D 25 Hydroxy (Vit-D  Deficiency, Fractures)   Iron deficiency anemia    Related to menses, perhaps post op. Advised protonix 20mg  when takes naprosyn. Likely doesn't require daily Protonix in the absence of GERD symptoms           I have discontinued Inga Vosler's dicyclomine and ibuprofen. I have also changed her pantoprazole. Additionally, I am having her maintain her ferrous sulfate, norgestimate-ethinyl estradiol, and naproxen.   Meds ordered this encounter  Medications  . pantoprazole (PROTONIX) 20 MG tablet    Sig: Take 1 tablet (20 mg total) by mouth daily.    Dispense:  30 tablet    Refill:  1    Order Specific Question:   Supervising Provider    Answer:   Crecencio Mc [[id number]]    Return precautions given.   Risks, benefits, and alternatives of the medications and treatment plan prescribed today were discussed, and patient expressed understanding.   Education regarding symptom management and diagnosis given to patient on AVS.  Continue to follow with Burnard Hawthorne, FNP for routine health maintenance.   Ann Stevens and I agreed with plan.   Mable Paris, FNP

## 2019-10-14 ENCOUNTER — Other Ambulatory Visit: Payer: Self-pay | Admitting: Obstetrics and Gynecology

## 2019-10-14 DIAGNOSIS — Z1231 Encounter for screening mammogram for malignant neoplasm of breast: Secondary | ICD-10-CM

## 2019-10-19 ENCOUNTER — Ambulatory Visit
Admission: RE | Admit: 2019-10-19 | Discharge: 2019-10-19 | Disposition: A | Discharge status: Home / Self Care | Source: Ambulatory Visit | Attending: Obstetrics and Gynecology | Admitting: Obstetrics and Gynecology

## 2019-10-19 ENCOUNTER — Other Ambulatory Visit: Payer: Self-pay | Admitting: Obstetrics and Gynecology

## 2019-10-19 DIAGNOSIS — N6310 Unspecified lump in the right breast, unspecified quadrant: Secondary | ICD-10-CM

## 2019-10-19 DIAGNOSIS — Z1231 Encounter for screening mammogram for malignant neoplasm of breast: Secondary | ICD-10-CM | POA: Diagnosis present

## 2019-10-19 DIAGNOSIS — R928 Other abnormal and inconclusive findings on diagnostic imaging of breast: Secondary | ICD-10-CM

## 2019-11-09 ENCOUNTER — Ambulatory Visit
Admission: RE | Admit: 2019-11-09 | Discharge: 2019-11-09 | Disposition: A | Discharge status: Home / Self Care | Source: Ambulatory Visit | Attending: Obstetrics and Gynecology | Admitting: Obstetrics and Gynecology

## 2019-11-09 DIAGNOSIS — N6310 Unspecified lump in the right breast, unspecified quadrant: Secondary | ICD-10-CM | POA: Diagnosis present

## 2019-11-09 DIAGNOSIS — R928 Other abnormal and inconclusive findings on diagnostic imaging of breast: Secondary | ICD-10-CM | POA: Insufficient documentation

## 2019-12-27 ENCOUNTER — Ambulatory Visit (INDEPENDENT_AMBULATORY_CARE_PROVIDER_SITE_OTHER): Admitting: Family

## 2019-12-27 ENCOUNTER — Encounter: Payer: Self-pay | Admitting: Family

## 2019-12-27 ENCOUNTER — Other Ambulatory Visit: Payer: Self-pay

## 2019-12-27 VITALS — BP 122/80 | HR 72 | Temp 97.3°F | Resp 15 | Ht 64.0 in | Wt 188.2 lb

## 2019-12-27 DIAGNOSIS — R8290 Unspecified abnormal findings in urine: Secondary | ICD-10-CM

## 2019-12-27 DIAGNOSIS — D508 Other iron deficiency anemias: Secondary | ICD-10-CM | POA: Diagnosis not present

## 2019-12-27 LAB — URINALYSIS, ROUTINE W REFLEX MICROSCOPIC
Bilirubin Urine: NEGATIVE
Ketones, ur: NEGATIVE
Nitrite: POSITIVE — AB
Specific Gravity, Urine: 1.025 (ref 1.000–1.030)
Total Protein, Urine: NEGATIVE
Urine Glucose: NEGATIVE
Urobilinogen, UA: 0.2 (ref 0.0–1.0)
pH: 6 (ref 5.0–8.0)

## 2019-12-27 LAB — IBC + FERRITIN
Ferritin: 26.4 ng/mL (ref 10.0–291.0)
Iron: 88 ug/dL (ref 42–145)
Saturation Ratios: 20.2 % (ref 20.0–50.0)
Transferrin: 311 mg/dL (ref 212.0–360.0)

## 2019-12-27 LAB — B12 AND FOLATE PANEL
Folate: 14.1 ng/mL (ref >5.9)
Vitamin B-12: 312 pg/mL (ref 211–911)

## 2019-12-27 LAB — VITAMIN D 25 HYDROXY (VIT D DEFICIENCY, FRACTURES): VITD: 21.97 ng/mL — ABNORMAL LOW (ref 30.00–100.00)

## 2019-12-27 NOTE — Assessment & Plan Note (Signed)
Symptoms overall improved thus we opted to defer empiric treatment today. Pending urine studies.

## 2019-12-27 NOTE — Progress Notes (Signed)
Subjective:    Patient ID: Ann Stevens, female    DOB: 1975/12/04, 44 y.o.   MRN: [id number]  CC: Ann Stevens is a 44 y.o. female who presents today for follow up.   HPI: Complains of  Pain at the end of urination, x 2 weeks, improved with water and cranberry juice. Urinary odor.  No apparent dysuria, urinary frequency, hematuria, vaginal itching, fever, chills.   Compliant with iron.  No fatigue, sob.   Has weaned off of protonix.  No epigastric burning, trouble swallowing.       HISTORY:  Past Medical History:  Diagnosis Date  . Anemia, iron deficiency    Past Surgical History:  Procedure Laterality Date  . CESAREAN SECTION  12/05/94,05/26/02  . CHOLECYSTECTOMY N/A 07/10/2019   Procedure: LAPAROSCOPIC CHOLECYSTECTOMY WITH INTRAOPERATIVE CHOLANGIOGRAM;  Surgeon: Fredirick Maudlin, MD;  Location: [facility] ORS;  Service: General;  Laterality: N/A;   Family History  Problem Relation Age of Onset  . Breast cancer Neg Hx   . Colon cancer Neg Hx     Allergies: Patient has no known allergies. Current Outpatient Medications on File Prior to Visit  Medication Sig Dispense Refill  . ferrous sulfate 325 (65 FE) MG EC tablet Take 1 tablet (325 mg total) by mouth 2 (two) times daily. 180 tablet 1  . norgestimate-ethinyl estradiol (MILI) 0.25-35 MG-MCG tablet Take 1 tablet by mouth daily.    . pantoprazole (PROTONIX) 20 MG tablet Take 1 tablet (20 mg total) by mouth daily. 30 tablet 1   No current facility-administered medications on file prior to visit.    Social History   Tobacco Use  . Smoking status: Never Smoker  . Smokeless tobacco: Never Used  Substance Use Topics  . Alcohol use: Yes    Comment: occ  . Drug use: Never    Review of Systems  Constitutional: Negative for chills, fatigue and fever.  Respiratory: Negative for cough and shortness of breath.   Cardiovascular: Negative for chest pain and palpitations.  Gastrointestinal: Negative for nausea and  vomiting.  Genitourinary: Negative for difficulty urinating, dysuria, flank pain, frequency, hematuria and vaginal discharge.      Objective:    BP 122/80 (BP Location: Left Arm, Patient Position: Sitting, Cuff Size: Normal)   Pulse 72   Temp (!) 97.3 F (36.3 C) (Temporal)   Resp 15   Ht 5\' 4"  (1.626 m)   Wt 188 lb 3.2 oz (85.4 kg)   SpO2 98%   BMI 32.30 kg/m  BP Readings from Last 3 Encounters:  12/27/19 122/80  09/23/19 100/68  09/07/19 100/64   Wt Readings from Last 3 Encounters:  12/27/19 188 lb 3.2 oz (85.4 kg)  09/23/19 186 lb 6.4 oz (84.6 kg)  09/07/19 186 lb (84.4 kg)    Physical Exam Vitals reviewed.  Constitutional:      Appearance: She is well-developed.  Cardiovascular:     Rate and Rhythm: Normal rate and regular rhythm.     Pulses: Normal pulses.     Heart sounds: Normal heart sounds.  Pulmonary:     Effort: Pulmonary effort is normal.     Breath sounds: Normal breath sounds. No wheezing, rhonchi or rales.  Skin:    General: Skin is warm and dry.  Neurological:     Mental Status: She is alert.  Psychiatric:        Speech: Speech normal.        Behavior: Behavior normal.        Thought  Content: Thought content normal.        Assessment & Plan:   Problem List Items Addressed This Visit      Other   Abnormal urine odor    Symptoms overall improved thus we opted to defer empiric treatment today. Pending urine studies.        Relevant Orders   Urinalysis, Routine w reflex microscopic   Urine Culture   Iron deficiency anemia - Primary    Asymptomatic.  Pending repeat iron studies      Relevant Orders   B12 and Folate Panel   IBC + Ferritin   VITAMIN D 25 Hydroxy (Vit-D Deficiency, Fractures)       I have discontinued Ann Stevens's naproxen. I am also having her maintain her ferrous sulfate, norgestimate-ethinyl estradiol, and pantoprazole.   No orders of the defined types were placed in this encounter.   Return precautions  given.   Risks, benefits, and alternatives of the medications and treatment plan prescribed today were discussed, and patient expressed understanding.   Education regarding symptom management and diagnosis given to patient on AVS.  Continue to follow with Burnard Hawthorne, FNP for routine health maintenance.   Ann Stevens and I agreed with plan.   Mable Paris, FNP

## 2019-12-27 NOTE — Patient Instructions (Addendum)
Vitamin D is slightly low. Please start cholecalciferol 800 units daily for next 3 to 4 months. You may find this over the counter in the drug store. Please call to make an appointment for 3 to 4 months out so we can recheck. Also, please ensure you are following a diet high in calcium -- research shows better outcomes with dietary sources including kale, yogurt, broccolii, cheese, okra, almonds- to name a few.    Nice to see you!

## 2019-12-27 NOTE — Assessment & Plan Note (Signed)
Asymptomatic.  Pending repeat iron studies

## 2019-12-29 ENCOUNTER — Other Ambulatory Visit: Payer: Self-pay | Admitting: Internal Medicine

## 2019-12-29 DIAGNOSIS — N3000 Acute cystitis without hematuria: Secondary | ICD-10-CM

## 2019-12-29 LAB — URINE CULTURE
MICRO NUMBER:: 10565841
SPECIMEN QUALITY:: ADEQUATE

## 2019-12-29 MED ORDER — CIPROFLOXACIN HCL 500 MG PO TABS: 500.0000 mg | ORAL_TABLET | Freq: Two times a day (BID) | ORAL | 0 refills | Status: DC

## 2020-05-24 ENCOUNTER — Telehealth: Payer: Self-pay

## 2020-05-24 NOTE — Telephone Encounter (Signed)
Pt calling; needs rx for bc; currentlrx has expired and she no longer sees that provider; the name of it is escarylla.  [phone]

## 2020-05-25 ENCOUNTER — Other Ambulatory Visit: Payer: Self-pay | Admitting: Obstetrics and Gynecology

## 2020-05-25 DIAGNOSIS — N921 Excessive and frequent menstruation with irregular cycle: Secondary | ICD-10-CM

## 2020-05-25 MED ORDER — NORGESTIMATE-ETH ESTRADIOL 0.25-35 MG-MCG PO TABS: 1.0000 | ORAL_TABLET | Freq: Every day | ORAL | 11 refills | Status: DC

## 2020-05-25 NOTE — Telephone Encounter (Signed)
Sent refill for what she has in the computer

## 2020-05-25 NOTE — Telephone Encounter (Signed)
Pt aware.

## 2020-06-27 ENCOUNTER — Ambulatory Visit: Admitting: Family

## 2020-06-27 ENCOUNTER — Telehealth: Admitting: Family

## 2020-06-27 ENCOUNTER — Encounter: Payer: Self-pay | Admitting: Family

## 2020-06-27 VITALS — Ht 64.0 in

## 2020-06-27 DIAGNOSIS — D508 Other iron deficiency anemias: Secondary | ICD-10-CM | POA: Diagnosis not present

## 2020-06-27 DIAGNOSIS — R21 Rash and other nonspecific skin eruption: Secondary | ICD-10-CM | POA: Diagnosis not present

## 2020-06-27 MED ORDER — NYSTATIN 100000 UNIT/GM EX OINT: 1.0000 "application " | TOPICAL_OINTMENT | Freq: Two times a day (BID) | CUTANEOUS | 2 refills | Status: DC

## 2020-06-27 NOTE — Progress Notes (Signed)
Subjective:    Patient ID: Ann Stevens, female    DOB: May 25, 1976, 44 y.o.   MRN: [id number]  CC: Ann Stevens is a 44 y.o. female who presents today for follow up.   HPI: Feels well today Had pruritic rash left abdomen, which was present for one month. Used hydrocortisone, diaper rash ointment, resolved.  The ras was not draining, no blisters.   No fever, fatigue, sob.  She uses various lotions which is normal for her and no h/o skin reaction.   Taking vitamin d 25 mcg, ferrous sulfate 325 BID  Taking OCP for menses and no longer no heavy. No clots. Regular monthly cycle.   NO further dysuria, hematuria.   Due to repeat UA due to RBCs.      GYN follow with Dr Ann Stevens  Pap smear, mammogram UTD.  Due colonoscopy next year  HISTORY:  Past Medical History:  Diagnosis Date  . Anemia, iron deficiency    Past Surgical History:  Procedure Laterality Date  . CESAREAN SECTION  12/05/94,05/26/02  . CHOLECYSTECTOMY N/A 07/10/2019   Procedure: LAPAROSCOPIC CHOLECYSTECTOMY WITH INTRAOPERATIVE CHOLANGIOGRAM;  Surgeon: Ann Maudlin, MD;  Location: [facility] ORS;  Service: General;  Laterality: N/A;   Family History  Problem Relation Age of Onset  . Breast cancer Neg Hx   . Colon cancer Neg Hx     Allergies: Patient has no known allergies. Current Outpatient Medications on File Prior to Visit  Medication Sig Dispense Refill  . cholecalciferol (VITAMIN D3) 25 MCG (1000 UNIT) tablet Take 1,000 Units by mouth daily.    . ferrous sulfate 325 (65 FE) MG EC tablet Take 1 tablet (325 mg total) by mouth 2 (two) times daily. 180 tablet 1  . norgestimate-ethinyl estradiol (MILI) 0.25-35 MG-MCG tablet Take 1 tablet by mouth daily. 28 tablet 11   No current facility-administered medications on file prior to visit.    Social History   Tobacco Use  . Smoking status: Never Smoker  . Smokeless tobacco: Never Used  Vaping Use  . Vaping Use: Never used  Substance Use Topics  .  Alcohol use: Yes    Comment: occ  . Drug use: Never    Review of Systems  Constitutional: Negative for chills, fatigue and fever.  Respiratory: Negative for cough.   Cardiovascular: Negative for chest pain and palpitations.  Gastrointestinal: Negative for nausea and vomiting.  Skin: Negative for rash (resolved).      Objective:    Ht 5\' 4"  (1.626 m)   BMI 32.30 kg/m  BP Readings from Last 3 Encounters:  12/27/19 122/80  09/23/19 100/68  09/07/19 100/64   Wt Readings from Last 3 Encounters:  12/27/19 188 lb 3.2 oz (85.4 kg)  09/23/19 186 lb 6.4 oz (84.6 kg)  09/07/19 186 lb (84.4 kg)    Physical Exam Vitals reviewed.  Constitutional:      Appearance: She is well-developed.  Eyes:     Conjunctiva/sclera: Conjunctivae normal.  Cardiovascular:     Rate and Rhythm: Normal rate and regular rhythm.     Pulses: Normal pulses.     Heart sounds: Normal heart sounds.  Pulmonary:     Effort: Pulmonary effort is normal.     Breath sounds: Normal breath sounds. No wheezing, rhonchi or rales.  Skin:    General: Skin is warm and dry.  Neurological:     Mental Status: She is alert.  Psychiatric:        Speech: Speech normal.  Behavior: Behavior normal.        Thought Content: Thought content normal.        Assessment & Plan:   Problem List Items Addressed This Visit      Musculoskeletal and Integument   Rash    Resolved. Based on area and response to medications, we agreed if recurs she may  Trial antifungal. She will call for an appointment if recurs and antifungal not effective.       Relevant Medications   nystatin ointment (MYCOSTATIN)     Other   Iron deficiency anemia - Primary    Asymptomatic. No anemia on 09/2019. Will look at iron stores and very likely advise patient to stop further supplementation.       Relevant Orders   CBC with Differential/Platelet   VITAMIN D 25 Hydroxy (Vit-D Deficiency, Fractures)   IBC + Ferritin   Urinalysis, Routine  w reflex microscopic       I have discontinued Ann Stevens's pantoprazole and ciprofloxacin. I am also having her start on nystatin ointment. Additionally, I am having her maintain her ferrous sulfate, norgestimate-ethinyl estradiol, and cholecalciferol.   Meds ordered this encounter  Medications  . nystatin ointment (MYCOSTATIN)    Sig: Apply 1 application topically 2 (two) times daily.    Dispense:  30 g    Refill:  2    Order Specific Question:   Supervising Provider    Answer:   Ann Stevens [[id number]]    Return precautions given.   Risks, benefits, and alternatives of the medications and treatment plan prescribed today were discussed, and patient expressed understanding.   Education regarding symptom management and diagnosis given to patient on AVS.  Continue to follow with Ann Hawthorne, FNP for routine health maintenance.   Ann Stevens and I agreed with plan.   Ann Paris, FNP

## 2020-06-27 NOTE — Patient Instructions (Signed)
Trial of nystatin for possible fungal infection.   Remember Due colonoscopy next year

## 2020-06-27 NOTE — Assessment & Plan Note (Signed)
Resolved. Based on area and response to medications, we agreed if recurs she may  Trial antifungal. She will call for an appointment if recurs and antifungal not effective.

## 2020-06-27 NOTE — Assessment & Plan Note (Signed)
Asymptomatic. No anemia on 09/2019. Will look at iron stores and very likely advise patient to stop further supplementation.

## 2020-07-05 ENCOUNTER — Other Ambulatory Visit (INDEPENDENT_AMBULATORY_CARE_PROVIDER_SITE_OTHER)

## 2020-07-05 ENCOUNTER — Other Ambulatory Visit: Payer: Self-pay

## 2020-07-05 DIAGNOSIS — D508 Other iron deficiency anemias: Secondary | ICD-10-CM | POA: Diagnosis not present

## 2020-07-05 LAB — CBC WITH DIFFERENTIAL/PLATELET
Basophils Absolute: 0 10*3/uL (ref 0.0–0.1)
Basophils Relative: 0.3 % (ref 0.0–3.0)
Eosinophils Absolute: 0.4 10*3/uL (ref 0.0–0.7)
Eosinophils Relative: 6.5 % — ABNORMAL HIGH (ref 0.0–5.0)
HCT: 39.6 % (ref 36.0–46.0)
Hemoglobin: 13 g/dL (ref 12.0–15.0)
Lymphocytes Relative: 39.8 % (ref 12.0–46.0)
Lymphs Abs: 2.7 10*3/uL (ref 0.7–4.0)
MCHC: 32.7 g/dL (ref 30.0–36.0)
MCV: 86 fl (ref 78.0–100.0)
Monocytes Absolute: 0.4 10*3/uL (ref 0.1–1.0)
Monocytes Relative: 5.5 % (ref 3.0–12.0)
Neutro Abs: 3.3 10*3/uL (ref 1.4–7.7)
Neutrophils Relative %: 47.9 % (ref 43.0–77.0)
Platelets: 325 10*3/uL (ref 150.0–400.0)
RBC: 4.61 Mil/uL (ref 3.87–5.11)
RDW: 14 % (ref 11.5–15.5)
WBC: 6.9 10*3/uL (ref 4.0–10.5)

## 2020-07-05 LAB — IBC + FERRITIN
Ferritin: 67.9 ng/mL (ref 10.0–291.0)
Iron: 54 ug/dL (ref 42–145)
Saturation Ratios: 14.8 % — ABNORMAL LOW (ref 20.0–50.0)
Transferrin: 260 mg/dL (ref 212.0–360.0)

## 2020-07-05 LAB — VITAMIN D 25 HYDROXY (VIT D DEFICIENCY, FRACTURES): VITD: 20.38 ng/mL — ABNORMAL LOW (ref 30.00–100.00)

## 2020-07-06 ENCOUNTER — Telehealth: Payer: Self-pay

## 2020-07-06 ENCOUNTER — Other Ambulatory Visit: Payer: Self-pay | Admitting: Family

## 2020-07-06 DIAGNOSIS — R319 Hematuria, unspecified: Secondary | ICD-10-CM

## 2020-07-06 LAB — URINALYSIS, ROUTINE W REFLEX MICROSCOPIC
Bilirubin Urine: NEGATIVE
Ketones, ur: NEGATIVE
Nitrite: NEGATIVE
Specific Gravity, Urine: 1.015 (ref 1.000–1.030)
Total Protein, Urine: NEGATIVE
Urine Glucose: NEGATIVE
Urobilinogen, UA: 0.2 (ref 0.0–1.0)
pH: 7 (ref 5.0–8.0)

## 2020-07-06 NOTE — Telephone Encounter (Signed)
LMTCB for lab results.  

## 2020-08-15 IMAGING — MG DIGITAL SCREENING BILAT W/ TOMO W/ CAD
6 of 10 series · 6 of 30 positions shown · non-contrast
Comparison: None.

CLINICAL DATA: Screening.

EXAM:
DIGITAL SCREENING BILATERAL MAMMOGRAM WITH TOMO AND CAD

[R CC synth-2D]
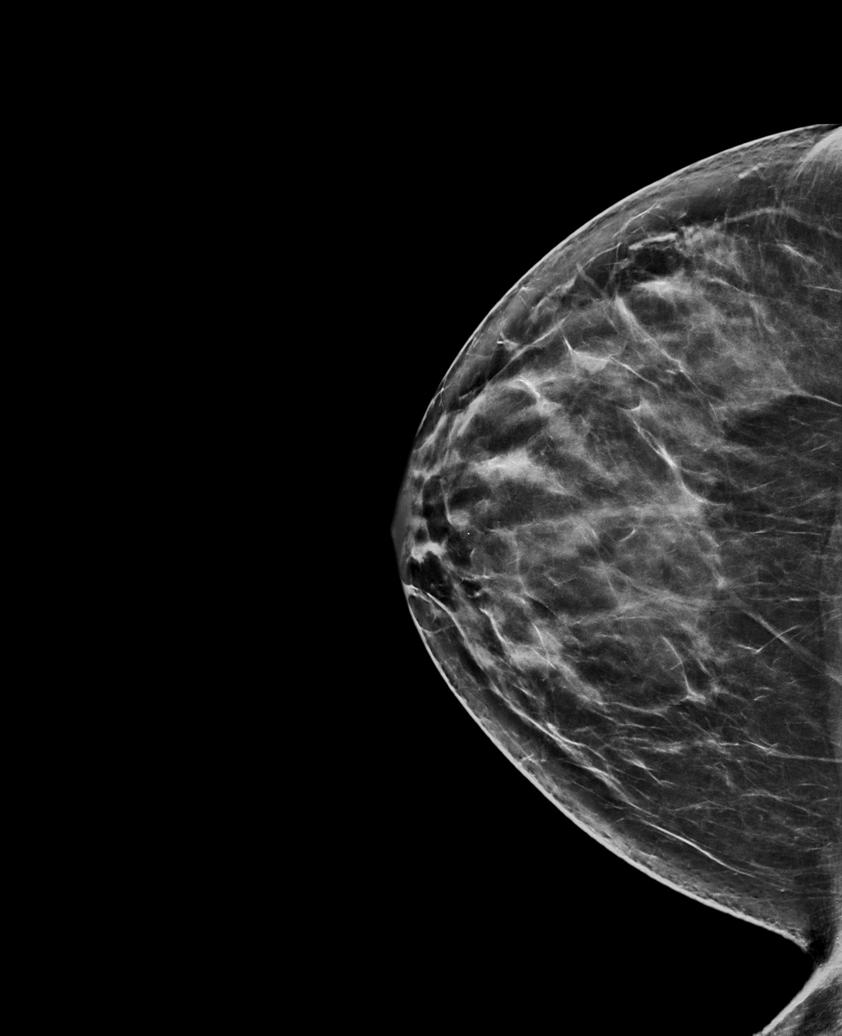

[R MLO synth-2D (1 of 2)]
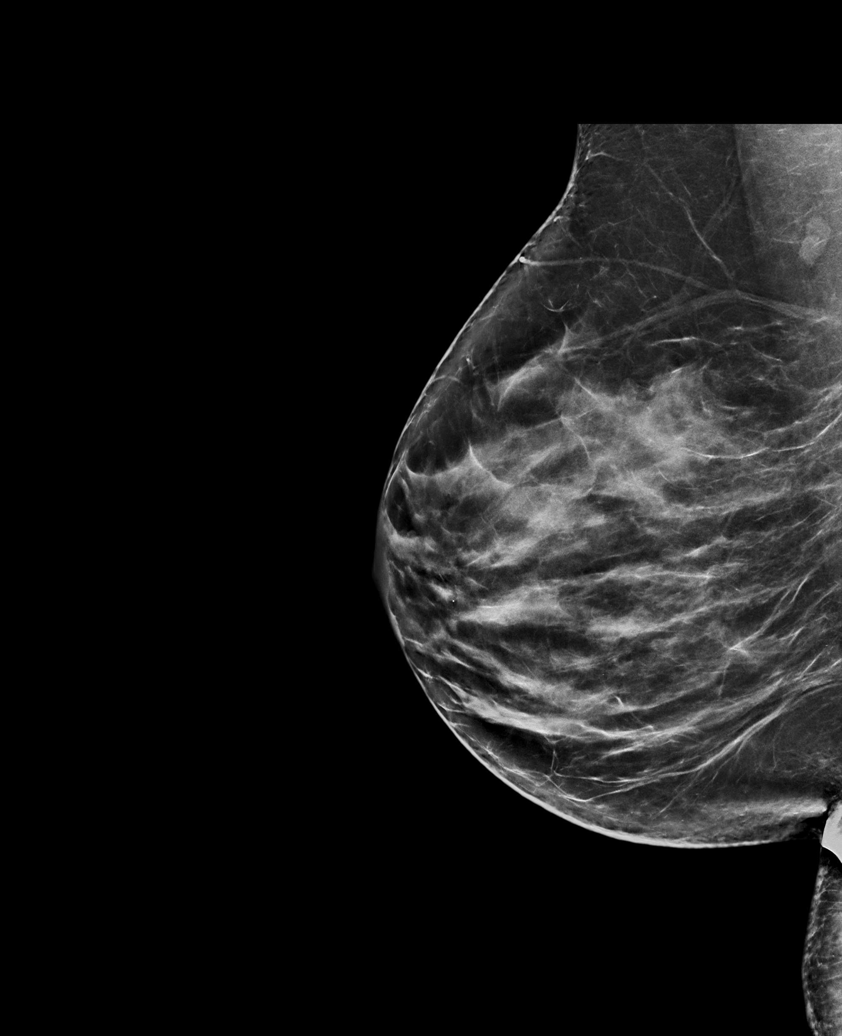

[R MLO synth-2D (2 of 2)]
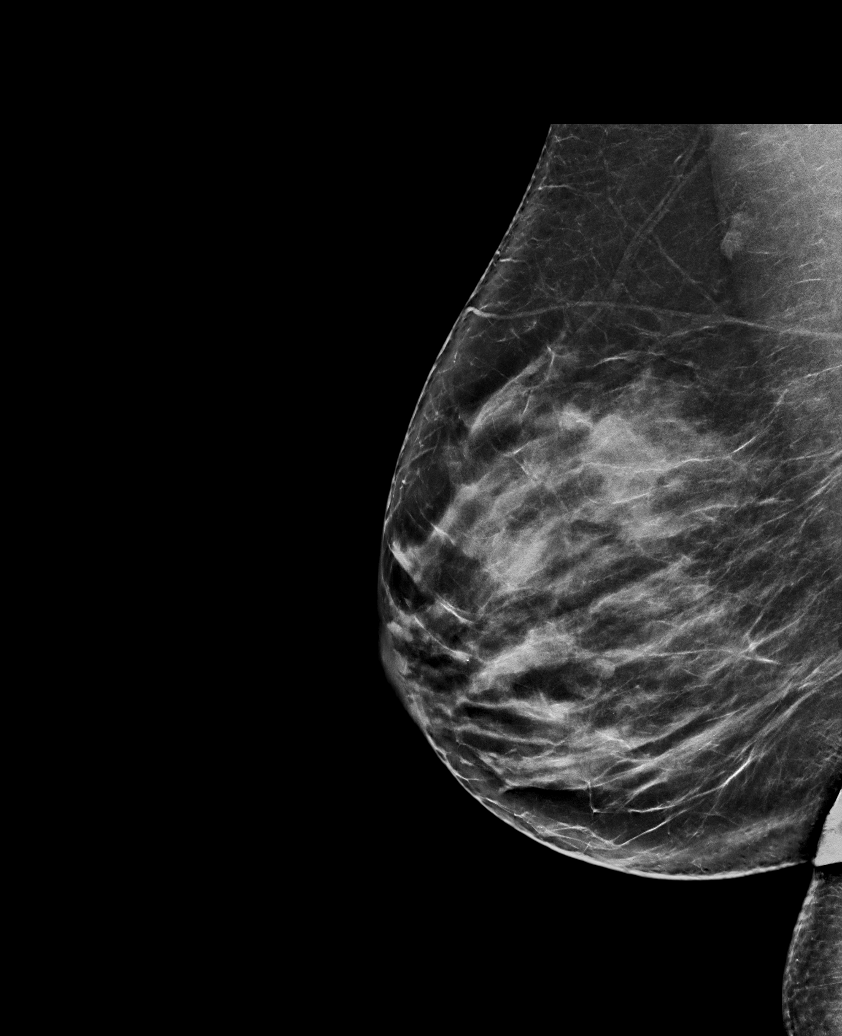

[L CC synth-2D]
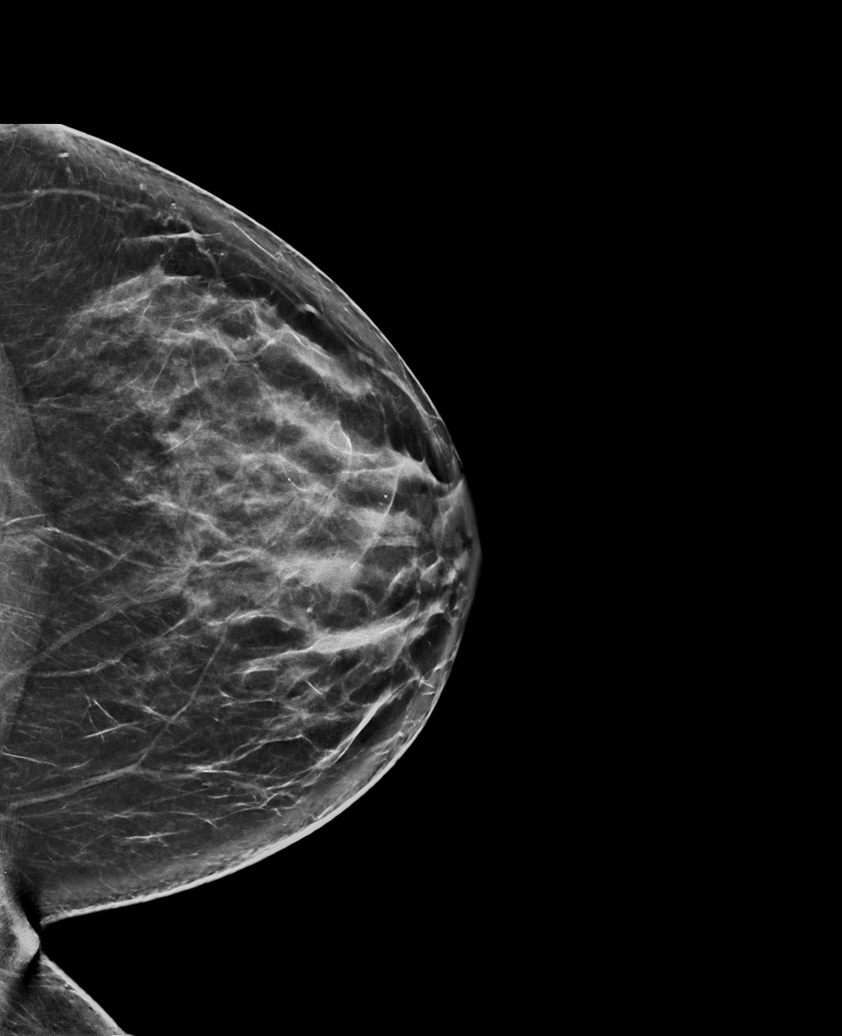

[L MLO synth-2D]
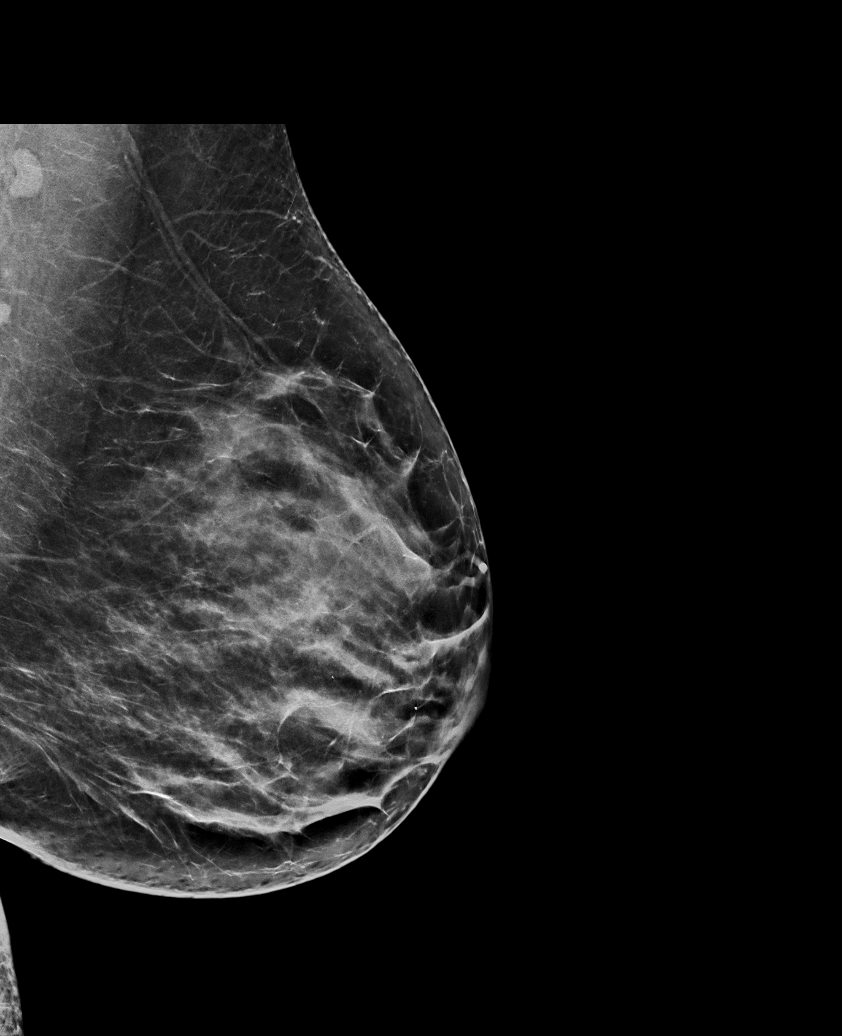

[R MLO tomo · tomo slice 40/79.0]
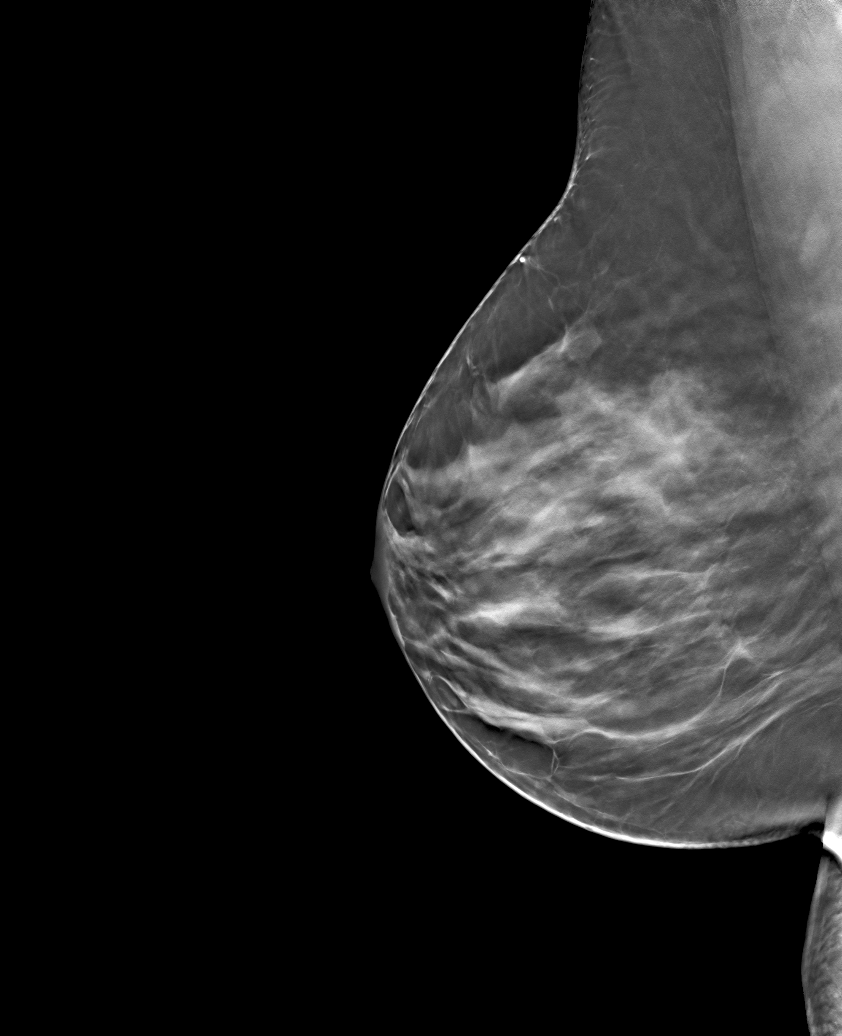

[6 of 30 positions shown; findings below may reference images not displayed]

ACR Breast Density Category c: The breast tissue is heterogeneously
dense, which may obscure small masses.
FINDINGS: In the right breast, a possible mass warrants further evaluation. In
the left breast, no findings suspicious for malignancy. Images were
processed with CAD.
IMPRESSION: Further evaluation is suggested for possible mass in the right
breast.

RECOMMENDATION:
Diagnostic mammogram and possibly ultrasound of the right breast.
(Code:C2-M-TTU)

The patient will be contacted regarding the findings, and additional
imaging will be scheduled.

BI-RADS CATEGORY  0: Incomplete. Need additional imaging evaluation
and/or prior mammograms for comparison.

## 2020-12-26 ENCOUNTER — Encounter: Admitting: Family

## 2021-01-31 ENCOUNTER — Other Ambulatory Visit: Payer: Self-pay | Admitting: Obstetrics and Gynecology

## 2021-01-31 DIAGNOSIS — N921 Excessive and frequent menstruation with irregular cycle: Secondary | ICD-10-CM

## 2021-02-05 ENCOUNTER — Encounter: Admitting: Family

## 2021-02-25 ENCOUNTER — Encounter: Payer: Self-pay | Admitting: Family

## 2021-02-25 ENCOUNTER — Other Ambulatory Visit: Payer: Self-pay

## 2021-02-25 ENCOUNTER — Ambulatory Visit (INDEPENDENT_AMBULATORY_CARE_PROVIDER_SITE_OTHER): Admitting: Family

## 2021-02-25 VITALS — BP 102/62 | HR 93 | Temp 98.8°F | Ht 64.0 in | Wt 200.6 lb

## 2021-02-25 DIAGNOSIS — Z1231 Encounter for screening mammogram for malignant neoplasm of breast: Secondary | ICD-10-CM | POA: Diagnosis not present

## 2021-02-25 DIAGNOSIS — E669 Obesity, unspecified: Secondary | ICD-10-CM

## 2021-02-25 DIAGNOSIS — Z0000 Encounter for general adult medical examination without abnormal findings: Secondary | ICD-10-CM

## 2021-02-25 LAB — CBC WITH DIFFERENTIAL/PLATELET
Basophils Absolute: 0.1 10*3/uL (ref 0.0–0.1)
Basophils Relative: 0.7 % (ref 0.0–3.0)
Eosinophils Absolute: 0.4 10*3/uL (ref 0.0–0.7)
Eosinophils Relative: 5.1 % — ABNORMAL HIGH (ref 0.0–5.0)
HCT: 37.8 % (ref 36.0–46.0)
Hemoglobin: 12.2 g/dL (ref 12.0–15.0)
Lymphocytes Relative: 30.7 % (ref 12.0–46.0)
Lymphs Abs: 2.6 10*3/uL (ref 0.7–4.0)
MCHC: 32.4 g/dL (ref 30.0–36.0)
MCV: 86.1 fl (ref 78.0–100.0)
Monocytes Absolute: 0.7 10*3/uL (ref 0.1–1.0)
Monocytes Relative: 8.2 % (ref 3.0–12.0)
Neutro Abs: 4.7 10*3/uL (ref 1.4–7.7)
Neutrophils Relative %: 55.3 % (ref 43.0–77.0)
Platelets: 331 10*3/uL (ref 150.0–400.0)
RBC: 4.39 Mil/uL (ref 3.87–5.11)
RDW: 14 % (ref 11.5–15.5)
WBC: 8.5 10*3/uL (ref 4.0–10.5)

## 2021-02-25 LAB — COMPREHENSIVE METABOLIC PANEL
ALT: 32 U/L (ref 0–35)
AST: 18 U/L (ref 0–37)
Albumin: 4.2 g/dL (ref 3.5–5.2)
Alkaline Phosphatase: 87 U/L (ref 39–117)
BUN: 8 mg/dL (ref 6–23)
CO2: 25 mEq/L (ref 19–32)
Calcium: 9.6 mg/dL (ref 8.4–10.5)
Chloride: 102 mEq/L (ref 96–112)
Creatinine, Ser: 0.75 mg/dL (ref 0.40–1.20)
GFR: 96.37 mL/min (ref >60.00)
Glucose, Bld: 110 mg/dL — ABNORMAL HIGH (ref 70–99)
Potassium: 4.1 mEq/L (ref 3.5–5.1)
Sodium: 136 mEq/L (ref 135–145)
Total Bilirubin: 0.3 mg/dL (ref 0.2–1.2)
Total Protein: 7.5 g/dL (ref 6.0–8.3)

## 2021-02-25 LAB — LIPID PANEL
Cholesterol: 264 mg/dL — ABNORMAL HIGH (ref 0–200)
HDL: 50.1 mg/dL (ref >39.00)
LDL Cholesterol: 177 mg/dL — ABNORMAL HIGH (ref 0–99)
NonHDL: 213.6
Total CHOL/HDL Ratio: 5
Triglycerides: 181 mg/dL — ABNORMAL HIGH (ref 0.0–149.0)
VLDL: 36.2 mg/dL (ref 0.0–40.0)

## 2021-02-25 LAB — VITAMIN D 25 HYDROXY (VIT D DEFICIENCY, FRACTURES): VITD: 21.4 ng/mL — ABNORMAL LOW (ref 30.00–100.00)

## 2021-02-25 LAB — HEMOGLOBIN A1C: Hgb A1c MFr Bld: 6.5 % (ref 4.6–6.5)

## 2021-02-25 LAB — TSH: TSH: 2.2 u[IU]/mL (ref 0.35–5.50)

## 2021-02-25 MED ORDER — WEGOVY 0.25 MG/0.5ML ~~LOC~~ SOAJ: 0.2500 mg | SUBCUTANEOUS | 2 refills | Status: DC

## 2021-02-25 NOTE — Assessment & Plan Note (Signed)
Deferred pap exam in the absence of pelvic concerns.  Pap smear is up-to-date.  Mammogram is ordered.  Referral for colonoscopy.

## 2021-02-25 NOTE — Progress Notes (Signed)
Subjective:    Patient ID: Ann Stevens, female    DOB: 1975/12/09, 45 y.o.   MRN: [id number]  CC: Ann Stevens is a 45 y.o. female who presents today for physical exam.    HPI: She has gained weight this past year.  She is frustrated by this.  She attributes this to working from home, and that she is snacking more often.  She is eating more watermelon, grapes.  She endorses indiscretion with ice cream.  No formal exercise at this time however she has recently purchased a treadmill and plans to an exercise program.    She has no personal or family history of thyroid cancer Colorectal Cancer Screening:due Breast Cancer Screening: Mammogram due Cervical Cancer Screening: UTD, negative malignancy, HPV 2021 Bone Health screening/DEXA for 65+: No increased fracture risk. Defer screening at this time.        Tetanus - due. She declines         Hepatitis C screening - Candidate for, she declines  Labs: Screening labs today. Exercise: No regular exercise.   Alcohol use:  occasional   Smoking/tobacco use: Nonsmoker.     HISTORY:  Past Medical History:  Diagnosis Date   Anemia, iron deficiency     Past Surgical History:  Procedure Laterality Date   CESAREAN SECTION  12/05/94,05/26/02   CHOLECYSTECTOMY N/A 07/10/2019   Procedure: LAPAROSCOPIC CHOLECYSTECTOMY WITH INTRAOPERATIVE CHOLANGIOGRAM;  Surgeon: Fredirick Maudlin, MD;  Location: [facility] ORS;  Service: General;  Laterality: N/A;   Family History  Problem Relation Age of Onset   Breast cancer Neg Hx    Colon cancer Neg Hx    Thyroid cancer Neg Hx       ALLERGIES: Patient has no known allergies.  Current Outpatient Medications on File Prior to Visit  Medication Sig Dispense Refill   cholecalciferol (VITAMIN D3) 25 MCG (1000 UNIT) tablet Take 1,000 Units by mouth daily.     norgestimate-ethinyl estradiol (MILI) 0.25-35 MG-MCG tablet Take 1 tablet by mouth daily. 28 tablet 11   nystatin ointment (MYCOSTATIN) Apply 1  application topically 2 (two) times daily. 30 g 2   No current facility-administered medications on file prior to visit.    Social History   Tobacco Use   Smoking status: Never   Smokeless tobacco: Never  Vaping Use   Vaping Use: Never used  Substance Use Topics   Alcohol use: Yes    Comment: occ   Drug use: Never    Review of Systems  Constitutional:  Negative for chills, fever and unexpected weight change.  HENT:  Negative for congestion.   Respiratory:  Negative for cough.   Cardiovascular:  Negative for chest pain, palpitations and leg swelling.  Gastrointestinal:  Negative for nausea and vomiting.  Musculoskeletal:  Negative for arthralgias and myalgias.  Skin:  Negative for rash.  Neurological:  Negative for headaches.  Hematological:  Negative for adenopathy.  Psychiatric/Behavioral:  Negative for confusion.      Objective:    BP 102/62 (BP Location: Left Arm, Patient Position: Sitting, Cuff Size: Large)   Pulse 93   Temp 98.8 F (37.1 C) (Oral)   Ht '5\' 4"'$  (1.626 m)   Wt 200 lb 9.6 oz (91 kg)   SpO2 97%   BMI 34.43 kg/m   BP Readings from Last 3 Encounters:  02/25/21 102/62  12/27/19 122/80  09/23/19 100/68   Wt Readings from Last 3 Encounters:  02/25/21 200 lb 9.6 oz (91 kg)  12/27/19 188 lb 3.2 oz (  85.4 kg)  09/23/19 186 lb 6.4 oz (84.6 kg)    Physical Exam Vitals reviewed.  Constitutional:      Appearance: She is well-developed.  Eyes:     Conjunctiva/sclera: Conjunctivae normal.  Neck:     Thyroid: No thyroid mass or thyromegaly.  Cardiovascular:     Rate and Rhythm: Normal rate and regular rhythm.     Pulses: Normal pulses.     Heart sounds: Normal heart sounds.  Pulmonary:     Effort: Pulmonary effort is normal.     Breath sounds: Normal breath sounds. No wheezing, rhonchi or rales.  Chest:  Breasts:    Breasts are symmetrical.     Right: No inverted nipple, mass, nipple discharge, skin change or tenderness.     Left: No inverted  nipple, mass, nipple discharge, skin change or tenderness.  Lymphadenopathy:     Head:     Right side of head: No submental, submandibular, tonsillar, preauricular, posterior auricular or occipital adenopathy.     Left side of head: No submental, submandibular, tonsillar, preauricular, posterior auricular or occipital adenopathy.     Cervical: No cervical adenopathy.     Right cervical: No superficial, deep or posterior cervical adenopathy.    Left cervical: No superficial, deep or posterior cervical adenopathy.  Skin:    General: Skin is warm and dry.  Neurological:     Mental Status: She is alert.  Psychiatric:        Speech: Speech normal.        Behavior: Behavior normal.        Thought Content: Thought content normal.       Assessment & Plan:   Problem List Items Addressed This Visit       Other   Obesity (BMI 30.0-34.9)    Counseled on black box warning as it relates to NBN, medullary thyroid cancer.  Counseled on low glycemic diet.  We agreed to start [facility] with close follow-up.       Relevant Medications   Semaglutide-Weight Management (WEGOVY) 0.25 MG/0.5ML SOAJ   Routine physical examination - Primary    Deferred pap exam in the absence of pelvic concerns.  Pap smear is up-to-date.  Mammogram is ordered.  Referral for colonoscopy.       Relevant Orders   VITAMIN D 25 Hydroxy (Vit-D Deficiency, Fractures)   Hemoglobin A1c   TSH   CBC with Differential/Platelet   Comprehensive metabolic panel   Lipid panel   MM 3D SCREEN BREAST BILATERAL   Ambulatory referral to Gastroenterology   Other Visit Diagnoses     Encounter for annual health examination       Relevant Orders   VITAMIN D 25 Hydroxy (Vit-D Deficiency, Fractures)   Hemoglobin A1c   TSH   CBC with Differential/Platelet   Comprehensive metabolic panel   Lipid panel   Encounter for screening mammogram for malignant neoplasm of breast       Relevant Orders   MM 3D SCREEN BREAST BILATERAL         I am having Ann Stevens start on Wegovy. I am also having her maintain her norgestimate-ethinyl estradiol, cholecalciferol, and nystatin ointment.   Meds ordered this encounter  Medications   Semaglutide-Weight Management (WEGOVY) 0.25 MG/0.5ML SOAJ    Sig: Inject 0.25 mg into the skin once a week.    Dispense:  2 mL    Refill:  2    Order Specific Question:   Supervising Provider    Answer:  TULLO, TERESA L [[id number]]    Return precautions given.   Risks, benefits, and alternatives of the medications and treatment plan prescribed today were discussed, and patient expressed understanding.   Education regarding symptom management and diagnosis given to patient on AVS.   Continue to follow with Burnard Hawthorne, FNP for routine health maintenance.   Ann Stevens and I agreed with plan.   Mable Paris, FNP

## 2021-02-25 NOTE — Assessment & Plan Note (Addendum)
Counseled on black box warning as it relates to MEN, medullary thyroid cancer.  Counseled on low glycemic diet.  We agreed to start [facility] with close follow-up.

## 2021-02-25 NOTE — Patient Instructions (Addendum)
Please call  and schedule your 3D mammogram as discussed.   Ann  Stevens, [name]  We have discussed starting non insulin daily injectable medication called Wegovy  which is a glucagon like peptide (GLP 1) agonist and works by delaying gastric emptying and increasing insulin secretion.It is given once per week. Most patients see significant weight loss with this drug class.   You may NOT take either medication if you or your family has history of thyroid, parathyroid, OR adrenal cancer. Please confirm you and your family does NOT have this history as this drug class has black box warning on this medication for that reason.   Advise to follow with directions on prescription and slowly increase from 0.'25mg'$  [name] once per week ;stay here for 4 weeks. You may then increase to 0.'5mg'$  [name] once per week and stay there for 4 weeks.  We can slowly titrate further at follow up with goal of no more than 1-2 lbs weight loss per week. This is  Dr. Lupita Dawn  example of a  "Low GI"  Diet:  It will allow you to lose 4 to 8  lbs  per month if you follow it carefully.  Your goal with exercise is a minimum of 30 minutes of aerobic exercise 5 days per week (Walking does not count once it becomes easy!)    All of the foods can be found at grocery stores and in bulk at [employer].  The Atkins protein bars and shakes are available in more varieties at [employer], [employer] and [place].     7 AM Breakfast:  Choose from the following:  Low carbohydrate Protein  Shakes (I recommend the  Premier Protein chocolate shakes,  EAS AdvantEdge "Carb Control" shakes  Or the Atkins shakes all are under 3 net carbs)     a scrambled egg/bacon/cheese burrito made with Mission's "carb balance" whole wheat tortilla  (about 10 net carbs )  Regulatory affairs officer (basically a quiche without the pastry crust) that is eaten cold and very convenient way to get your  eggs.  8 carbs)  If you make your own protein shakes, avoid bananas and pineapple,  And use low carb greek yogurt or original /unsweetened almond or soy milk    Avoid cereal and bananas, oatmeal and cream of wheat and grits. They are loaded with carbohydrates!   10 AM: high protein snack:  Protein bar by Atkins (the snack size, under 200 cal, usually < 6 net carbs).    A stick of cheese:  Around 1 carb,  100 cal     Dannon Light n Fit [place] Yogurt  (80 cal, 8 carbs)  Other so called "protein bars" and Greek yogurts tend to be loaded with carbohydrates.  Remember, in food advertising, the word "energy" is synonymous for " carbohydrate."  Lunch:   A Sandwich using the bread choices listed, Can use any  Eggs,  lunchmeat, grilled meat or canned tuna), avocado, regular mayo/mustard  and cheese.  A Salad using blue cheese, ranch,  Goddess or vinagrette,  Avoid taco shells, croutons or "confetti" and no "candied nuts" but regular nuts OK.   No pretzels, nabs  or chips.  Pickles and miniature sweet peppers are a good low carb alternative that provide a "crunch"  The bread is the only source of carbohydrate in a sandwich and  can be decreased by trying some of the attached alternatives to  traditional loaf bread   Avoid "Low fat dressings, as well as [place] dressings They are loaded with sugar!   3 PM/ Mid day  Snack:  Consider  1 ounce of  almonds, walnuts, pistachios, pecans, peanuts,  Macadamia nuts or a nut medley.  Avoid "granola and granola bars "  Mixed nuts are ok in moderation as long as there are no raisins,  cranberries or dried fruit.   KIND bars are OK if you get the low glycemic index variety   Try the prosciutto/mozzarella cheese sticks by Fiorruci  In deli /backery section   High protein      6 PM  Dinner:     Meat/fowl/fish with a green salad, and either broccoli, cauliflower, green beans, spinach, brussel sprouts or  Lima beans. DO NOT BREAD THE PROTEIN!!       There is a low carb pasta by Dreamfield's that is acceptable and tastes great: only 5 digestible carbs/serving.( All grocery stores but BJs carry it ) Several ready made meals are available low carb:   Try Michel Angelo's chicken piccata or chicken or eggplant parm over low carb pasta.(Lowes and BJs)   Marjory Lies Sanchez's "Carnitas" (pulled pork, no sauce,  0 carbs) or his beef pot roast to make a dinner burrito (at [employer]J's)  Pesto over low carb pasta (bj's sells a good quality pesto in the center refrigerated section of the deli   Try satueeing  Cheral Marker with mushroooms as a good side   [employer] Giant makes a mashed cauliflower that tastes like mashed potatoes  Whole wheat pasta is still full of digestible carbs and  Not as low in glycemic index as Dreamfield's.   Brown rice is still rice,  So skip the rice and noodles if you eat [place] or [place] (or at least limit to 1/2 cup)  9 PM snack :   Breyer's "low carb" fudgsicle or  ice cream bar (Carb Smart line), or  Weight Watcher's ice cream bar , or another "no sugar added" ice cream;  a serving of fresh berries/cherries with whipped cream   Cheese or DANNON'S LlGHT N FIT GREEK YOGURT  8 ounces of [place] unsweetened almond/cococunut milk    Treat yourself to a parfait made with whipped cream blueberiies, walnuts and vanilla greek yogurt  Avoid bananas, pineapple, grapes  and watermelon on a regular basis because they are high in sugar.  THINK OF THEM AS DESSERT  Remember that snack Substitutions should be less than 10 NET carbs per serving and meals < 20 carbs. Remember to subtract fiber grams to get the "net carbs." Health Maintenance, Female Adopting a healthy lifestyle and getting preventive care are important in promoting health and wellness. Ask your health care provider about: The right schedule for you to have regular tests and exams. Things you can do on your own to prevent diseases and keep yourself healthy. What should I know about  diet, weight, and exercise? Eat a healthy diet  Eat a diet that includes plenty of vegetables, fruits, low-fat dairy products, and lean protein. Do not eat a lot of foods that are high in solid fats, added sugars, or sodium.  Maintain a healthy weight Body mass index (BMI) is used to identify weight problems. It estimates body fat based on height and weight. Your health care provider can help determineyour BMI and help you achieve or maintain a healthy weight. Get regular exercise Get regular exercise. This is one of the most  important things you can do for your health. Most adults should: Exercise for at least 150 minutes each week. The exercise should increase your heart rate and make you sweat (moderate-intensity exercise). Do strengthening exercises at least twice a week. This is in addition to the moderate-intensity exercise. Spend less time sitting. Even light physical activity can be beneficial. Watch cholesterol and blood lipids Have your blood tested for lipids and cholesterol at 45 years of age, then havethis test every 5 years. Have your cholesterol levels checked more often if: Your lipid or cholesterol levels are high. You are older than 45 years of age. You are at high risk for heart disease. What should I know about cancer screening? Depending on your health history and family history, you may need to have cancer screening at various ages. This may include screening for: Breast cancer. Cervical cancer. Colorectal cancer. Skin cancer. Lung cancer. What should I know about heart disease, diabetes, and high blood pressure? Blood pressure and heart disease High blood pressure causes heart disease and increases the risk of stroke. This is more likely to develop in people who have high blood pressure readings, are of African descent, or are overweight. Have your blood pressure checked: Every 3-5 years if you are 56-32 years of age. Every year if you are 21 years old or  older. Diabetes Have regular diabetes screenings. This checks your fasting blood sugar level. Have the screening done: Once every three years after age 17 if you are at a normal weight and have a low risk for diabetes. More often and at a younger age if you are overweight or have a high risk for diabetes. What should I know about preventing infection? Hepatitis B If you have a higher risk for hepatitis B, you should be screened for this virus. Talk with your health care provider to find out if you are at risk forhepatitis B infection. Hepatitis C Testing is recommended for: Everyone born from [birth year] through 1965. Anyone with known risk factors for hepatitis C. Sexually transmitted infections (STIs) Get screened for STIs, including gonorrhea and chlamydia, if: You are sexually active and are younger than 45 years of age. You are older than 45 years of age and your health care provider tells you that you are at risk for this type of infection. Your sexual activity has changed since you were last screened, and you are at increased risk for chlamydia or gonorrhea. Ask your health care provider if you are at risk. Ask your health care provider about whether you are at high risk for HIV. Your health care provider may recommend a prescription medicine to help prevent HIV infection. If you choose to take medicine to prevent HIV, you should first get tested for HIV. You should then be tested every 3 months for as long as you are taking the medicine. Pregnancy If you are about to stop having your period (premenopausal) and you may become pregnant, seek counseling before you get pregnant. Take 400 to 800 micrograms (mcg) of folic acid every day if you become pregnant. Ask for birth control (contraception) if you want to prevent pregnancy. Osteoporosis and menopause Osteoporosis is a disease in which the bones lose minerals and strength with aging. This can result in bone fractures. If you are 90 years old  or older, or if you are at risk for osteoporosis and fractures, ask your health care provider if you should: Be screened for bone loss. Take a calcium or vitamin D supplement to lower  your risk of fractures. Be given hormone replacement therapy (HRT) to treat symptoms of menopause. Follow these instructions at home: Lifestyle Do not use any products that contain nicotine or tobacco, such as cigarettes, e-cigarettes, and chewing tobacco. If you need help quitting, ask your health care provider. Do not use street drugs. Do not share needles. Ask your health care provider for help if you need support or information about quitting drugs. Alcohol use Do not drink alcohol if: Your health care provider tells you not to drink. You are pregnant, may be pregnant, or are planning to become pregnant. If you drink alcohol: Limit how much you use to 0-1 drink a day. Limit intake if you are breastfeeding. Be aware of how much alcohol is in your drink. In the [place] one drink equals one 12 oz bottle of beer (355 mL), one 5 oz glass of wine (148 mL), or one 1 oz glass of hard liquor (44 mL). General instructions Schedule regular health, dental, and eye exams. Stay current with your vaccines. Tell your health care provider if: You often feel depressed. You have ever been abused or do not feel safe at home. Summary Adopting a healthy lifestyle and getting preventive care are important in promoting health and wellness. Follow your health care provider's instructions about healthy diet, exercising, and getting tested or screened for diseases. Follow your health care provider's instructions on monitoring your cholesterol and blood pressure. This information is not intended to replace advice given to you by your health care provider. Make sure you discuss any questions you have with your healthcare provider. Document Revised: 06/30/2018 Document Reviewed: 06/30/2018 Elsevier Patient Education  2022 [employer].

## 2021-02-26 ENCOUNTER — Encounter: Payer: Self-pay | Admitting: Family

## 2021-03-05 NOTE — Telephone Encounter (Signed)
For your information  

## 2021-03-06 ENCOUNTER — Ambulatory Visit
Admission: RE | Admit: 2021-03-06 | Discharge: 2021-03-06 | Disposition: A | Discharge status: Home / Self Care | Source: Ambulatory Visit | Attending: Family | Admitting: Family

## 2021-03-06 ENCOUNTER — Other Ambulatory Visit: Payer: Self-pay

## 2021-03-06 DIAGNOSIS — Z1231 Encounter for screening mammogram for malignant neoplasm of breast: Secondary | ICD-10-CM | POA: Insufficient documentation

## 2021-03-06 DIAGNOSIS — Z0000 Encounter for general adult medical examination without abnormal findings: Secondary | ICD-10-CM | POA: Diagnosis present

## 2021-03-08 ENCOUNTER — Telehealth: Payer: Self-pay

## 2021-03-08 ENCOUNTER — Other Ambulatory Visit: Payer: Self-pay | Admitting: Family

## 2021-03-08 DIAGNOSIS — N6489 Other specified disorders of breast: Secondary | ICD-10-CM

## 2021-03-08 DIAGNOSIS — R928 Other abnormal and inconclusive findings on diagnostic imaging of breast: Secondary | ICD-10-CM

## 2021-03-08 NOTE — Telephone Encounter (Signed)
LMTCB in regards to lab results.  

## 2021-03-12 ENCOUNTER — Other Ambulatory Visit: Payer: Self-pay

## 2021-03-12 ENCOUNTER — Telehealth: Payer: Self-pay

## 2021-03-12 DIAGNOSIS — Z1211 Encounter for screening for malignant neoplasm of colon: Secondary | ICD-10-CM

## 2021-03-12 MED ORDER — NA SULFATE-K SULFATE-MG SULF 17.5-3.13-1.6 GM/177ML PO SOLN: 1.0000 | Freq: Once | ORAL | 0 refills | Status: AC

## 2021-03-12 NOTE — Progress Notes (Unsigned)
Gastroenterology Pre-Procedure Review  Request Date: 05/01/2021 Requesting Physician: Dr. Bonna Gains  PATIENT REVIEW QUESTIONS: The patient responded to the following health history questions as indicated:    1. Are you having any GI issues? no 2. Do you have a personal history of Polyps? no 3. Do you have a family history of Colon Cancer or Polyps? no 4. Diabetes Mellitus? no 5. Joint replacements in the past 12 months?no 6. Major health problems in the past 3 months?no 7. Any artificial heart valves, MVP, or defibrillator?no    MEDICATIONS & ALLERGIES:    Patient reports the following regarding taking any anticoagulation/antiplatelet therapy:   Plavix, Coumadin, Eliquis, Xarelto, Lovenox, Pradaxa, Brilinta, or Effient? no Aspirin? no  Patient confirms/reports the following medications:  Current Outpatient Medications  Medication Sig Dispense Refill   cholecalciferol (VITAMIN D3) 25 MCG (1000 UNIT) tablet Take 1,000 Units by mouth daily.     norgestimate-ethinyl estradiol (MILI) 0.25-35 MG-MCG tablet Take 1 tablet by mouth daily. 28 tablet 11   nystatin ointment (MYCOSTATIN) Apply 1 application topically 2 (two) times daily. 30 g 2   Semaglutide-Weight Management (WEGOVY) 0.25 MG/0.5ML SOAJ Inject 0.25 mg into the skin once a week. 2 mL 2   No current facility-administered medications for this visit.    Patient confirms/reports the following allergies:  No Known Allergies  No orders of the defined types were placed in this encounter.   AUTHORIZATION INFORMATION Primary Insurance: 1D#: Group #:  Secondary Insurance: 1D#: Group #:  SCHEDULE INFORMATION: Date: 05/01/2021 Time: Location:[facility]

## 2021-03-12 NOTE — Telephone Encounter (Signed)
LMTCB in regards to lab results.  

## 2021-03-14 ENCOUNTER — Telehealth: Payer: Self-pay

## 2021-03-14 MED ORDER — METFORMIN HCL ER 500 MG PO TB24: 500.0000 mg | ORAL_TABLET | Freq: Every day | ORAL | 0 refills | Status: DC

## 2021-03-14 MED ORDER — ROSUVASTATIN CALCIUM 5 MG PO TABS: 5.0000 mg | ORAL_TABLET | Freq: Every day | ORAL | 0 refills | Status: DC

## 2021-03-14 NOTE — Telephone Encounter (Signed)
Forwarded message from [place] to pt per her request and sent in new rxs.

## 2021-03-22 ENCOUNTER — Telehealth: Payer: Self-pay | Admitting: Family

## 2021-03-22 DIAGNOSIS — R21 Rash and other nonspecific skin eruption: Secondary | ICD-10-CM

## 2021-03-22 MED ORDER — TRIAMCINOLONE ACETONIDE 0.025 % EX CREA: 1.0000 "application " | TOPICAL_CREAM | Freq: Two times a day (BID) | CUTANEOUS | 0 refills | Status: DC

## 2021-03-22 NOTE — Telephone Encounter (Signed)
Call patient Please advise her that I reviewed up-to-date medical journal and do rash not a common side effect of either metformin or Crestor.   Please confirm with the rash is not ulcer-like blisters in her mouth or on her hands.  Please also confirm she is not having fever, shortness of breath or difficulty managing her secretions, difficulty swallowing.  These all be reasons to go emergency room  If we suspect the onset of the rash coincides with the medications of Crestor and metformin, then yes please have patient's stop both of these medications as rash should dissipate once they are stopped if rash is from these medications.  If rash not improved in the next 12 to 24 hours, please advise that she go to urgent care for an in person evaluation.  She may take both pepcid ac and claritin over the counter to block histamine and control the itching. I have also sent steroid cream   Please schedule follow-up with me so we can decide how to reintroduce this medication.

## 2021-03-22 NOTE — Telephone Encounter (Signed)
Patient informed, Due to the high volume of calls and your symptoms we have to forward your call to our Triage Nurse to expedient your call. Please hold for the transfer.  Patient transferred to [facility]. Due to thinking she may be having an allergic reaction to metformin with extreme itching on her torso and back since Monday.No openings in office or virtual.

## 2021-03-22 NOTE — Telephone Encounter (Signed)
Called to speak with [place]. She declines blisters in her mouth or on her hands. She states that the raised bumps did not come to a head and there is no secretions. Pt declines any Fever and other difficulties. Pt states that she will stop taking both the metformin and the crestor and scheduled to see Mable Paris on 04/15/21. Ann Stevens verbalized understanding and had no further questions.

## 2021-04-15 ENCOUNTER — Ambulatory Visit: Admitting: Family

## 2021-04-19 ENCOUNTER — Encounter: Payer: Self-pay | Admitting: General Surgery

## 2021-04-22 ENCOUNTER — Telehealth: Payer: Self-pay | Admitting: Family

## 2021-04-22 DIAGNOSIS — R899 Unspecified abnormal finding in specimens from other organs, systems and tissues: Secondary | ICD-10-CM

## 2021-04-22 NOTE — Telephone Encounter (Signed)
Pt has a lab appt 04/24/2021, there are no orders in.

## 2021-04-23 NOTE — Addendum Note (Signed)
Addended by: Burnard Hawthorne on: 04/23/2021 08:21 AM   Modules accepted: Orders

## 2021-04-24 ENCOUNTER — Other Ambulatory Visit (INDEPENDENT_AMBULATORY_CARE_PROVIDER_SITE_OTHER)

## 2021-04-24 ENCOUNTER — Other Ambulatory Visit: Payer: Self-pay

## 2021-04-24 DIAGNOSIS — R899 Unspecified abnormal finding in specimens from other organs, systems and tissues: Secondary | ICD-10-CM | POA: Diagnosis not present

## 2021-04-24 LAB — CBC WITH DIFFERENTIAL/PLATELET
Basophils Absolute: 0.1 10*3/uL (ref 0.0–0.1)
Basophils Relative: 0.9 % (ref 0.0–3.0)
Eosinophils Absolute: 0.5 10*3/uL (ref 0.0–0.7)
Eosinophils Relative: 4.9 % (ref 0.0–5.0)
HCT: 38.1 % (ref 36.0–46.0)
Hemoglobin: 12.2 g/dL (ref 12.0–15.0)
Lymphocytes Relative: 38.7 % (ref 12.0–46.0)
Lymphs Abs: 3.7 10*3/uL (ref 0.7–4.0)
MCHC: 32 g/dL (ref 30.0–36.0)
MCV: 85.7 fl (ref 78.0–100.0)
Monocytes Absolute: 0.6 10*3/uL (ref 0.1–1.0)
Monocytes Relative: 6.3 % (ref 3.0–12.0)
Neutro Abs: 4.7 10*3/uL (ref 1.4–7.7)
Neutrophils Relative %: 49.2 % (ref 43.0–77.0)
Platelets: 336 10*3/uL (ref 150.0–400.0)
RBC: 4.44 Mil/uL (ref 3.87–5.11)
RDW: 14.3 % (ref 11.5–15.5)
WBC: 9.5 10*3/uL (ref 4.0–10.5)

## 2021-04-25 ENCOUNTER — Other Ambulatory Visit

## 2021-05-01 ENCOUNTER — Ambulatory Visit: Admitting: Anesthesiology

## 2021-05-01 ENCOUNTER — Encounter: Payer: Self-pay | Admitting: Gastroenterology

## 2021-05-01 ENCOUNTER — Ambulatory Visit
Admission: RE | Admit: 2021-05-01 | Discharge: 2021-05-01 | Disposition: A | Discharge status: Home / Self Care | Attending: Gastroenterology | Admitting: Gastroenterology

## 2021-05-01 ENCOUNTER — Encounter
Admission: RE | Disposition: A | Discharge status: Home / Self Care | Payer: Self-pay | Source: Home / Self Care | Attending: Gastroenterology

## 2021-05-01 DIAGNOSIS — Z1211 Encounter for screening for malignant neoplasm of colon: Secondary | ICD-10-CM

## 2021-05-01 DIAGNOSIS — E669 Obesity, unspecified: Secondary | ICD-10-CM | POA: Insufficient documentation

## 2021-05-01 DIAGNOSIS — K635 Polyp of colon: Secondary | ICD-10-CM

## 2021-05-01 DIAGNOSIS — Z6833 Body mass index (BMI) 33.0-33.9, adult: Secondary | ICD-10-CM | POA: Diagnosis not present

## 2021-05-01 DIAGNOSIS — K219 Gastro-esophageal reflux disease without esophagitis: Secondary | ICD-10-CM | POA: Diagnosis not present

## 2021-05-01 DIAGNOSIS — D125 Benign neoplasm of sigmoid colon: Secondary | ICD-10-CM | POA: Diagnosis not present

## 2021-05-01 DIAGNOSIS — Z79899 Other long term (current) drug therapy: Secondary | ICD-10-CM | POA: Diagnosis not present

## 2021-05-01 HISTORY — PX: COLONOSCOPY WITH PROPOFOL: SHX5780

## 2021-05-01 LAB — POCT PREGNANCY, URINE: Preg Test, Ur: NEGATIVE

## 2021-05-01 SURGERY — COLONOSCOPY WITH PROPOFOL
Anesthesia: General

## 2021-05-01 MED ORDER — LIDOCAINE HCL (CARDIAC) PF 100 MG/5ML IV SOSY
PREFILLED_SYRINGE | INTRAVENOUS | Status: DC | PRN
  Administered 2021-05-01: 50 mg via INTRAVENOUS

## 2021-05-01 MED ORDER — LIDOCAINE HCL (PF) 2 % IJ SOLN
INTRAMUSCULAR | Status: AC
  Filled 2021-05-01: qty 5

## 2021-05-01 MED ORDER — SODIUM CHLORIDE 0.9 % IV SOLN
INTRAVENOUS | Status: DC
  Administered 2021-05-01: 1000 mL via INTRAVENOUS

## 2021-05-01 MED ORDER — PROPOFOL 500 MG/50ML IV EMUL
INTRAVENOUS | Status: AC
  Filled 2021-05-01: qty 50

## 2021-05-01 MED ORDER — PROPOFOL 500 MG/50ML IV EMUL
INTRAVENOUS | Status: DC | PRN
  Administered 2021-05-01: 160 ug/kg/min via INTRAVENOUS

## 2021-05-01 MED ORDER — PROPOFOL 10 MG/ML IV BOLUS
INTRAVENOUS | Status: DC | PRN
  Administered 2021-05-01: 20 mg via INTRAVENOUS
  Administered 2021-05-01: 60 mg via INTRAVENOUS

## 2021-05-01 NOTE — H&P (Signed)
Vonda Antigua, MD [address], [place], [place], [place], [address] [address], [place], [place], [place], [address] Phone: [phone]  Fax: [phone]  Primary Care Physician:  Burnard Hawthorne, FNP   Pre-Procedure History & Physical: HPI:  Ann Stevens is a 45 y.o. female is here for a colonoscopy.   Past Medical History:  Diagnosis Date   Anemia, iron deficiency     Past Surgical History:  Procedure Laterality Date   CESAREAN SECTION  12/05/94,05/26/02   CHOLECYSTECTOMY N/A 07/10/2019   Procedure: LAPAROSCOPIC CHOLECYSTECTOMY WITH INTRAOPERATIVE CHOLANGIOGRAM;  Surgeon: Fredirick Maudlin, MD;  Location: [facility] ORS;  Service: General;  Laterality: N/A;    Prior to Admission medications   Medication Sig Start Date End Date Taking? Authorizing Provider  cholecalciferol (VITAMIN D3) 25 MCG (1000 UNIT) tablet Take 1,000 Units by mouth daily.   Yes [provider]  norgestimate-ethinyl estradiol (MILI) 0.25-35 MG-MCG tablet Take 1 tablet by mouth daily. 05/25/20  Yes Schuman, Christanna R, MD  nystatin ointment (MYCOSTATIN) Apply 1 application topically 2 (two) times daily. 06/27/20  Yes Arnett, Yvetta Coder, FNP  rosuvastatin (CRESTOR) 5 MG tablet Take 1 tablet (5 mg total) by mouth daily. 03/14/21  Yes Burnard Hawthorne, FNP  triamcinolone (KENALOG) 0.025 % cream Apply 1 application topically 2 (two) times daily. 03/22/21  Yes Burnard Hawthorne, FNP  metFORMIN (GLUCOPHAGE-XR) 500 MG 24 hr tablet Take 1 tablet (500 mg total) by mouth daily with breakfast. Patient not taking: Reported on 05/01/2021 03/14/21   Burnard Hawthorne, FNP  Semaglutide-Weight Management (WEGOVY) 0.25 MG/0.5ML SOAJ Inject 0.25 mg into the skin once a week. Patient not taking: Reported on 05/01/2021 02/25/21   Burnard Hawthorne, FNP    Allergies as of 03/12/2021   (No Known Allergies)    Family History  Problem Relation Age of Onset   Breast cancer Neg Hx    Colon cancer Neg Hx     Thyroid cancer Neg Hx     Social History   Socioeconomic History   Marital status: Married    Spouse name: Not on file   Number of children: Not on file   Years of education: Not on file   Highest education level: Not on file  Occupational History   Not on file  Tobacco Use   Smoking status: Never   Smokeless tobacco: Never  Vaping Use   Vaping Use: Never used  Substance and Sexual Activity   Alcohol use: Yes    Comment: occ   Drug use: Never   Sexual activity: Yes    Birth control/protection: Pill  Other Topics Concern   Not on file  Social History Narrative   Working from home   [place] [facility]   Social Determinants of Health   Financial Resource Strain: Not on file  Food Insecurity: Not on file  Transportation Needs: Not on file  Physical Activity: Not on file  Stress: Not on file  Social Connections: Not on file  Intimate Partner Violence: Not on file    Review of Systems: See HPI, otherwise negative ROS  Physical Exam: Constitutional: General:   Alert,  Well-developed, well-nourished, pleasant and cooperative in NAD BP (!) 143/105   Pulse 95   Temp (!) 96.9 F (36.1 C) (Temporal)   Resp 18   Ht 5\' 4"  (1.626 m)   Wt 88.6 kg   SpO2 100%   BMI 33.54 kg/m   Head: Normocephalic, atraumatic.   Eyes:  Sclera clear, no icterus.  Conjunctiva pink.   Mouth:  No deformity or lesions, oropharynx pink & moist.  Neck:  Supple, trachea midline  Respiratory: Normal respiratory effort  Gastrointestinal:  Soft, non-tender and non-distended without masses, hepatosplenomegaly or hernias noted.  No guarding or rebound tenderness.     Cardiac: No clubbing or edema.  No cyanosis. Normal posterior tibial pedal pulses noted.  Lymphatic:  No significant cervical adenopathy.  Psych:  Alert and cooperative. Normal mood and affect.  Musculoskeletal:   Symmetrical without gross deformities. 5/5 Lower extremity strength bilaterally.  Skin: Warm. Intact without  significant lesions or rashes. No jaundice.  Neurologic:  Face symmetrical, tongue midline, Normal sensation to touch;  grossly normal neurologically.  Psych:  Alert and oriented x3, Alert and cooperative. Normal mood and affect.  Impression/Plan: Ann Stevens is here for a colonoscopy to be performed for average risk screening.  Risks, benefits, limitations, and alternatives regarding  colonoscopy have been reviewed with the patient.  Questions have been answered.  All parties agreeable.   Virgel Manifold, MD  05/01/2021, 7:44 AM

## 2021-05-01 NOTE — Anesthesia Preprocedure Evaluation (Signed)
Anesthesia Evaluation  Patient identified by MRN, date of birth, ID band Patient awake    Reviewed: Allergy & Precautions, NPO status , Patient's Chart, lab work & pertinent test results  History of Anesthesia Complications Negative for: history of anesthetic complications  Airway Mallampati: II   Neck ROM: Full    Dental   Missing few molars:   Pulmonary neg pulmonary ROS,    Pulmonary exam normal breath sounds clear to auscultation       Cardiovascular Exercise Tolerance: Good negative cardio ROS Normal cardiovascular exam Rhythm:Regular Rate:Normal     Neuro/Psych negative neurological ROS     GI/Hepatic GERD  ,  Endo/Other  Obesity   Renal/GU negative Renal ROS     Musculoskeletal   Abdominal   Peds  Hematology  (+) Blood dyscrasia, anemia ,   Anesthesia Other Findings   Reproductive/Obstetrics                             Anesthesia Physical Anesthesia Plan  ASA: 2  Anesthesia Plan: General   Post-op Pain Management:    Induction: Intravenous  PONV Risk Score and Plan: 3 and Propofol infusion, TIVA and Treatment may vary due to age or medical condition  Airway Management Planned: Natural Airway  Additional Equipment:   Intra-op Plan:   Post-operative Plan:   Informed Consent: I have reviewed the patients History and Physical, chart, labs and discussed the procedure including the risks, benefits and alternatives for the proposed anesthesia with the patient or authorized representative who has indicated his/her understanding and acceptance.       Plan Discussed with: CRNA  Anesthesia Plan Comments:         Anesthesia Quick Evaluation

## 2021-05-01 NOTE — Anesthesia Postprocedure Evaluation (Signed)
Anesthesia Post Note  Patient: Ann Stevens  Procedure(s) Performed: COLONOSCOPY WITH PROPOFOL  Patient location during evaluation: PACU Anesthesia Type: General Level of consciousness: awake and alert, oriented and patient cooperative Pain management: pain level controlled Vital Signs Assessment: post-procedure vital signs reviewed and stable Respiratory status: spontaneous breathing, nonlabored ventilation and respiratory function stable Cardiovascular status: blood pressure returned to baseline and stable Postop Assessment: adequate PO intake Anesthetic complications: no   No notable events documented.   Last Vitals:  Vitals:   05/01/21 0729 05/01/21 0817  BP: (!) 143/105 119/64  Pulse: 95   Resp: 18   Temp: (!) 36.1 C (!) 35.9 C  SpO2: 100%     Last Pain:  Vitals:   05/01/21 0837  TempSrc:   PainSc: 0-No pain                 Darrin Nipper

## 2021-05-01 NOTE — Op Note (Signed)
[facility] Patient Name: Ann Stevens Procedure Date: 05/01/2021 7:29 AM MRN: [id number] Account #: [id number] Date of Birth: 06-21-1976 Admit Type: Outpatient Age: 45 Room: [facility] ENDO ROOM 3 Gender: Female Note Status: Finalized Instrument Name: Jasper Riling [id number] Procedure:             Colonoscopy Indications:           Screening for colorectal malignant neoplasm Providers:             Shanele Nissan B. Bonna Gains MD, MD Referring MD:          Yvetta Coder. Arnett (Referring MD) Medicines:             Monitored Anesthesia Care Complications:         No immediate complications. Procedure:             Pre-Anesthesia Assessment:                        - ASA Grade Assessment: II - A patient with mild                         systemic disease.                        - Prior to the procedure, a History and Physical was                         performed, and patient medications, allergies and                         sensitivities were reviewed. The patient's tolerance                         of previous anesthesia was reviewed.                        - The risks and benefits of the procedure and the                         sedation options and risks were discussed with the                         patient. All questions were answered and informed                         consent was obtained.                        - Patient identification and proposed procedure were                         verified prior to the procedure by the physician, the                         nurse, the anesthesiologist, the anesthetist and the                         technician. The procedure was verified in the  procedure room.                        After obtaining informed consent, the colonoscope was                         passed under direct vision. Throughout the procedure,                         the patient's blood pressure, pulse, and oxygen                          saturations were monitored continuously. The                         Colonoscope was introduced through the anus and                         advanced to the the cecum, identified by appendiceal                         orifice and ileocecal valve. The colonoscopy was                         performed with ease. The patient tolerated the                         procedure well. The quality of the bowel preparation                         was good. Findings:      The perianal and digital rectal examinations were normal.      Six flat and sessile polyps were found in the sigmoid colon. The polyps       were 4 to 8 mm in size. These polyps were removed with a cold snare.       Resection and retrieval were complete.      The exam was otherwise without abnormality.      The rectum, sigmoid colon, descending colon, transverse colon, ascending       colon and cecum appeared normal.      The retroflexed view of the distal rectum and anal verge was normal and       showed no anal or rectal abnormalities. Impression:            - Six 4 to 8 mm polyps in the sigmoid colon, removed                         with a cold snare. Resected and retrieved.                        - The examination was otherwise normal.                        - The rectum, sigmoid colon, descending colon,                         transverse colon, ascending colon and cecum are normal.                        -  The distal rectum and anal verge are normal on                         retroflexion view. Recommendation:        - Discharge patient to home (with escort).                        - Advance diet as tolerated.                        - Continue present medications.                        - Await pathology results.                        - Repeat colonoscopy date to be determined after                         pending pathology results are reviewed.                        - The findings and recommendations were  discussed with                         the patient.                        - The findings and recommendations were discussed with                         the patient's family.                        - Return to primary care physician as previously                         scheduled. Procedure Code(s):     --- Professional ---                        [phone], Colonoscopy, flexible; with removal of                         tumor(s), polyp(s), or other lesion(s) by snare                         technique Diagnosis Code(s):     --- Professional ---                        Z12.11, Encounter for screening for malignant neoplasm                         of colon                        K63.5, Polyp of colon CPT copyright 2019 American Medical Association. All rights reserved. The codes documented in this report are preliminary and upon coder review may  be revised to meet current compliance requirements.  Vonda Antigua, MD Margretta Sidle B. Bonna Gains MD, MD 05/01/2021 8:21:48 AM This report has been signed electronically. Number of  Addenda: 0 Note Initiated On: 05/01/2021 7:29 AM Scope Withdrawal Time: 0 hours 16 minutes 6 seconds  Total Procedure Duration: 0 hours 21 minutes 21 seconds  Estimated Blood Loss:  Estimated blood loss: none.      [facility]

## 2021-05-01 NOTE — Transfer of Care (Signed)
Immediate Anesthesia Transfer of Care Note  Patient: [name]  Procedure(s) Performed: COLONOSCOPY WITH PROPOFOL  Patient Location: PACU  Anesthesia Type:General  Level of Consciousness: drowsy  Airway & Oxygen Therapy: Patient Spontanous Breathing  Post-op Assessment: Report given to RN and Post -op Vital signs reviewed and stable  Post vital signs: Reviewed and stable  Last Vitals:  Vitals Value Taken Time  BP 119/64 05/01/21 0818  Temp 35.9 C 05/01/21 0817  Pulse 88 05/01/21 0818  Resp 17 05/01/21 0818  SpO2 100 % 05/01/21 0818  Vitals shown include unvalidated device data.  Last Pain:  Vitals:   05/01/21 0817  TempSrc: Temporal  PainSc:          Complications: No notable events documented.

## 2021-05-02 ENCOUNTER — Encounter: Payer: Self-pay | Admitting: Gastroenterology

## 2021-05-02 LAB — SURGICAL PATHOLOGY

## 2021-05-06 ENCOUNTER — Encounter: Payer: Self-pay | Admitting: Gastroenterology

## 2021-05-10 ENCOUNTER — Other Ambulatory Visit: Payer: Self-pay | Admitting: Obstetrics and Gynecology

## 2021-05-10 DIAGNOSIS — N921 Excessive and frequent menstruation with irregular cycle: Secondary | ICD-10-CM

## 2021-05-17 ENCOUNTER — Ambulatory Visit
Admission: RE | Admit: 2021-05-17 | Discharge: 2021-05-17 | Disposition: A | Discharge status: Home / Self Care | Source: Ambulatory Visit | Attending: Family | Admitting: Family

## 2021-05-17 ENCOUNTER — Other Ambulatory Visit: Payer: Self-pay

## 2021-05-17 DIAGNOSIS — N6489 Other specified disorders of breast: Secondary | ICD-10-CM | POA: Insufficient documentation

## 2021-05-17 DIAGNOSIS — R928 Other abnormal and inconclusive findings on diagnostic imaging of breast: Secondary | ICD-10-CM | POA: Diagnosis present

## 2021-06-03 ENCOUNTER — Ambulatory Visit: Admitting: Family

## 2021-06-09 ENCOUNTER — Other Ambulatory Visit: Payer: Self-pay | Admitting: Family

## 2021-07-08 ENCOUNTER — Other Ambulatory Visit: Payer: Self-pay

## 2021-07-08 ENCOUNTER — Encounter: Payer: Self-pay | Admitting: Family

## 2021-07-08 ENCOUNTER — Ambulatory Visit (INDEPENDENT_AMBULATORY_CARE_PROVIDER_SITE_OTHER): Admitting: Family

## 2021-07-08 VITALS — BP 118/82 | HR 75 | Temp 98.6°F | Ht 64.0 in | Wt 202.4 lb

## 2021-07-08 DIAGNOSIS — G4700 Insomnia, unspecified: Secondary | ICD-10-CM | POA: Diagnosis not present

## 2021-07-08 DIAGNOSIS — R7303 Prediabetes: Secondary | ICD-10-CM | POA: Diagnosis not present

## 2021-07-08 DIAGNOSIS — R899 Unspecified abnormal finding in specimens from other organs, systems and tissues: Secondary | ICD-10-CM | POA: Diagnosis not present

## 2021-07-08 DIAGNOSIS — E119 Type 2 diabetes mellitus without complications: Secondary | ICD-10-CM | POA: Insufficient documentation

## 2021-07-08 LAB — POCT GLYCOSYLATED HEMOGLOBIN (HGB A1C): Hemoglobin A1C: 6.2 % — AB (ref 4.0–5.6)

## 2021-07-08 MED ORDER — TRAZODONE HCL 50 MG PO TABS: 25.0000 mg | ORAL_TABLET | Freq: Every evening | ORAL | 1 refills | Status: DC | PRN

## 2021-07-08 NOTE — Assessment & Plan Note (Signed)
Lab Results  Component Value Date   HGBA1C 6.2 (A) 07/08/2021   Improved. Discussed low glycemic diet. Will follow.

## 2021-07-08 NOTE — Progress Notes (Signed)
Subjective:    Patient ID: Ann Stevens, female    DOB: 09-Apr-1976, 45 y.o.   MRN: [id number]  CC: Ann Stevens is a 45 y.o. female who presents today for follow up.   HPI: Trouble falling asleep for a long time.  Taking OTC sleep medication Unisom 50mg  without relief.  Tried melatonin without relief. She doesn't think related to anxiety or depression  She is not taking wegovy nor metformin   HISTORY:  Past Medical History:  Diagnosis Date   Anemia, iron deficiency    Past Surgical History:  Procedure Laterality Date   CESAREAN SECTION  12/05/94,05/26/02   CHOLECYSTECTOMY N/A 07/10/2019   Procedure: LAPAROSCOPIC CHOLECYSTECTOMY WITH INTRAOPERATIVE CHOLANGIOGRAM;  Surgeon: Fredirick Maudlin, MD;  Location: [facility] ORS;  Service: General;  Laterality: N/A;   COLONOSCOPY WITH PROPOFOL N/A 05/01/2021   Procedure: COLONOSCOPY WITH PROPOFOL;  Surgeon: Virgel Manifold, MD;  Location: [facility] ENDOSCOPY;  Service: Endoscopy;  Laterality: N/A;   Family History  Problem Relation Age of Onset   Breast cancer Neg Hx    Colon cancer Neg Hx    Thyroid cancer Neg Hx     Allergies: Patient has no known allergies. Current Outpatient Medications on File Prior to Visit  Medication Sig Dispense Refill   cholecalciferol (VITAMIN D3) 25 MCG (1000 UNIT) tablet Take 1,000 Units by mouth daily.     ESTARYLLA 0.25-35 MG-MCG tablet TAKE 1 TABLET BY MOUTH DAILY 84 tablet 3   nystatin ointment (MYCOSTATIN) Apply 1 application topically 2 (two) times daily. 30 g 2   No current facility-administered medications on file prior to visit.    Social History   Tobacco Use   Smoking status: Never   Smokeless tobacco: Never  Vaping Use   Vaping Use: Never used  Substance Use Topics   Alcohol use: Yes    Comment: occ   Drug use: Never    Review of Systems  Constitutional:  Negative for chills and fever.  Respiratory:  Negative for cough.   Cardiovascular:  Negative for chest pain and  palpitations.  Gastrointestinal:  Negative for nausea and vomiting.  Psychiatric/Behavioral:  Positive for sleep disturbance.      Objective:    BP 118/82 (BP Location: Left Arm, Patient Position: Sitting, Cuff Size: Large)    Pulse 75    Temp 98.6 F (37 C) (Oral)    Ht 5\' 4"  (1.626 m)    Wt 202 lb 6.4 oz (91.8 kg)    SpO2 98%    BMI 34.74 kg/m  BP Readings from Last 3 Encounters:  07/08/21 118/82  05/01/21 119/64  02/25/21 102/62   Wt Readings from Last 3 Encounters:  07/08/21 202 lb 6.4 oz (91.8 kg)  05/01/21 195 lb 6.3 oz (88.6 kg)  02/25/21 200 lb 9.6 oz (91 kg)    Physical Exam Vitals reviewed.  Constitutional:      Appearance: She is well-developed.  Eyes:     Conjunctiva/sclera: Conjunctivae normal.  Cardiovascular:     Rate and Rhythm: Normal rate and regular rhythm.     Pulses: Normal pulses.     Heart sounds: Normal heart sounds.  Pulmonary:     Effort: Pulmonary effort is normal.     Breath sounds: Normal breath sounds. No wheezing, rhonchi or rales.  Skin:    General: Skin is warm and dry.  Neurological:     Mental Status: She is alert.  Psychiatric:        Speech: Speech normal.  Behavior: Behavior normal.        Thought Content: Thought content normal.       Assessment & Plan:   Problem List Items Addressed This Visit       Other   Insomnia - Primary    Chronic, uncontrolled. Start trazodone 25-50mg . Encouraged sleep hygiene. She will let me know how she is doing.      Relevant Medications   traZODone (DESYREL) 50 MG tablet   Prediabetes    Lab Results  Component Value Date   HGBA1C 6.2 (A) 07/08/2021  Improved. Discussed low glycemic diet. Will follow.       Other Visit Diagnoses     Abnormal laboratory test       Relevant Orders   POCT HgB A1C (Completed)        I have discontinued [facility], triamcinolone, rosuvastatin, and metFORMIN. I am also having her start on traZODone. Additionally, I am having her  maintain her cholecalciferol, nystatin ointment, and Estarylla.   Meds ordered this encounter  Medications   traZODone (DESYREL) 50 MG tablet    Sig: Take 0.5-1 tablets (25-50 mg total) by mouth at bedtime as needed for sleep.    Dispense:  90 tablet    Refill:  1    Order Specific Question:   Supervising Provider    Answer:   Crecencio Mc [[id number]]    Return precautions given.   Risks, benefits, and alternatives of the medications and treatment plan prescribed today were discussed, and patient expressed understanding.   Education regarding symptom management and diagnosis given to patient on AVS.  Continue to follow with Burnard Hawthorne, FNP for routine health maintenance.   Ann Stevens and I agreed with plan.   Mable Paris, FNP

## 2021-07-08 NOTE — Patient Instructions (Signed)
Start trazodone 25mg  daily and may increase to 50mg  if not effective.   Please let me know if sleep doesn't improve.   Nice to see you!

## 2021-07-08 NOTE — Assessment & Plan Note (Signed)
Chronic, uncontrolled. Start trazodone 25-50mg . Encouraged sleep hygiene. She will let me know how she is doing.

## 2021-07-12 ENCOUNTER — Encounter: Payer: Self-pay | Admitting: Family

## 2021-07-19 ENCOUNTER — Other Ambulatory Visit: Payer: Self-pay | Admitting: Family

## 2022-01-02 ENCOUNTER — Encounter: Payer: Self-pay | Admitting: Family

## 2022-01-02 ENCOUNTER — Ambulatory Visit: Admitting: Family

## 2022-01-02 VITALS — BP 126/78 | HR 67 | Temp 98.3°F | Ht 63.0 in | Wt 205.6 lb

## 2022-01-02 DIAGNOSIS — Z0000 Encounter for general adult medical examination without abnormal findings: Secondary | ICD-10-CM

## 2022-01-02 DIAGNOSIS — E669 Obesity, unspecified: Secondary | ICD-10-CM

## 2022-01-02 DIAGNOSIS — G4700 Insomnia, unspecified: Secondary | ICD-10-CM

## 2022-01-02 LAB — POCT GLYCOSYLATED HEMOGLOBIN (HGB A1C): Hemoglobin A1C: 6.1 % — AB (ref 4.0–5.6)

## 2022-01-02 MED ORDER — SAXENDA 18 MG/3ML ~~LOC~~ SOPN: 0.6000 mg | PEN_INJECTOR | Freq: Every day | SUBCUTANEOUS | 3 refills | Status: DC

## 2022-01-02 MED ORDER — ZOLPIDEM TARTRATE ER 6.25 MG PO TBCR
6.2500 mg | EXTENDED_RELEASE_TABLET | Freq: Every evening | ORAL | 2 refills | Status: DC | PRN
Start: 2022-01-02 — End: 2024-05-25

## 2022-01-02 NOTE — Assessment & Plan Note (Signed)
Lab Results  Component Value Date   HGBA1C 6.2 (A) 07/08/2021   Fortunately unable to obtain [facility]. allergy to metformin.  We agreed trial of Saxenda is very reasonable in setting of prediabetes.  Counseled her on side effects administration medication.  Counseled her a black box warning as it relates to medullary thyroid cancer, multiple endocrine neoplasia.  She will let me know how she is doing

## 2022-01-02 NOTE — Patient Instructions (Signed)
Start ambien  No alcohol with Ambien.  Please let me know of any unusual dreams, sleepwalking or safety concerns   start Saxenda 0.'6mg'$  subcutaneously ([place]) ; you may take this injection daily and will gradually increase as tolerated to maximum dose of '3mg'$  injected subcutaneous daily. You do not have to increase each week, you can go weeks without increasing.  If you do increase, you do this by increasing dose 0.'6mg'$  once per week.  For example: Week one : 0.'6mg'$  [place] daily Week two: 1.'2mg'$  [place] daily  Week three: 1.8 mg [place] daily Week four: 2.'4mg'$  [place] daily Week five: '3mg'$  [place] daily AND STAY THERE.  Remember you may NOT take this medication if ANY personal or family history of thyroid cancer so please let us know asap if this history were to change.   Please call the office to schedule a nurse visit if you have any concern about administration of this medication as we discussed.   Liraglutide Injection (Weight Management) What is this medication? LIRAGLUTIDE (LIR a GLOO tide) promotes weight loss. It may also be used to maintain weight loss. It works by decreasing appetite. Changes to diet and exercise are often combined with this medication. This medicine may be used for other purposes; ask your health care provider or pharmacist if you have questions. COMMON BRAND NAME(S): Saxenda What should I tell my care team before I take this medication? They need to know if you have any of these conditions: Endocrine tumors (MEN 2) or if someone in your family had these tumors Gallbladder disease High cholesterol History of alcohol abuse problem History of pancreatitis Kidney disease or if you are on dialysis Liver disease Previous swelling of the tongue, face, or lips with difficulty breathing, difficulty swallowing, hoarseness, or tightening of the throat Stomach problems Suicidal thoughts, plans, or attempt; a previous suicide attempt by you or a family member Thyroid cancer or if someone in your  family had thyroid cancer An unusual or allergic reaction to liraglutide, other medications, foods, dyes, or preservatives Pregnant or trying to get pregnant Breast-feeding How should I use this medication? This medication is for injection under the skin of your upper leg, stomach area, or upper arm. You will be taught how to prepare and give this medication. Use exactly as directed. Take your medication at regular intervals. Do not take it more often than directed. This medication comes with INSTRUCTIONS FOR USE. Ask your pharmacist for directions on how to use this medication. Read the information carefully. Talk to your pharmacist or care team if you have questions. It is important that you put your used needles and syringes in a special sharps container. Do not put them in a trash can. If you do not have a sharps container, call your pharmacist or care team to get one. A special MedGuide will be given to you by the pharmacist with each prescription and refill. Be sure to read this information carefully each time. Talk to your care team about the use of this medication in children. While it may be prescribed for children as young as 56 years of age for selected conditions, precautions do apply. Overdosage: If you think you have taken too much of this medicine contact a poison control center or emergency room at once. NOTE: This medicine is only for you. Do not share this medicine with others. What if I miss a dose? If you miss a dose, take it as soon as you can. If it is almost time for  your next dose, take only that dose. Do not take double or extra doses. If you miss your dose for 3 days or more, call your care team to talk about how to restart this medicine. What may interact with this medication? Insulin and other medications for diabetes This list may not describe all possible interactions. Give your health care provider a list of all the medicines, herbs, non-prescription drugs, or dietary  supplements you use. Also tell them if you smoke, drink alcohol, or use illegal drugs. Some items may interact with your medicine. What should I watch for while using this medication? Visit your care team for regular checks on your progress. Drink plenty of fluids while taking this medication. Check with your care team if you get an attack of severe diarrhea, nausea, and vomiting. The loss of too much body fluid can make it dangerous for you to take this medication. This medication may affect blood sugar levels. Ask your care team if changes in diet or medications are needed if you have diabetes. Patients and their families should watch out for worsening depression or thoughts of suicide. Also watch out for sudden changes in feelings such as feeling anxious, agitated, panicky, irritable, hostile, aggressive, impulsive, severely restless, overly excited and hyperactive, or not being able to sleep. If this happens, especially at the beginning of treatment or after a change in dose, call your care team. Women should inform their care team if they wish to become pregnant or think they might be pregnant. Losing weight while pregnant is not advised and may cause harm to the unborn child. Talk to your care team for more information. What side effects may I notice from receiving this medication? Side effects that you should report to your care team as soon as possible: Allergic reactions or angioedema--skin rash, itching, hives, swelling of the face, eyes, lips, tongue, arms, or legs, trouble swallowing or breathing Fast or irregular heartbeat Gallbladder problems--severe stomach pain, nausea, vomiting, fever Kidney injury--decrease in the amount of urine, swelling of the ankles, hands, or feet Pancreatitis--severe stomach pain that spreads to your back or gets worse after eating or when touched, fever, nausea, vomiting Thoughts of suicide or self-harm, worsening mood, feelings of depression Thyroid  cancer--new mass or lump in the neck, pain or trouble swallowing, trouble breathing, hoarseness Side effects that usually do not require medical attention (report to your care team if they continue or are bothersome): Constipation Dizziness Fatigue Headache Loss of Appetite Nausea Upset stomach This list may not describe all possible side effects. Call your doctor for medical advice about side effects. You may report side effects to FDA at 1-800-FDA-1088. Where should I keep my medication? Keep out of the reach of children and pets. Store unopened pen in a refrigerator between 2 and 8 degrees C (36 and 46 degrees F). Do not freeze or use if the medication has been frozen. Protect from light and excessive heat. After you first use the pen, it can be stored at room temperature between 15 and 30 degrees C (59 and 86 degrees F) or in a refrigerator. Throw away your used pen after 30 days or after the expiration date, whichever comes first. Do not store your pen with the needle attached. If the needle is left on, medication may leak from the pen. NOTE: This sheet is a summary. It may not cover all possible information. If you have questions about this medicine, talk to your doctor, pharmacist, or health care provider.  2023 Elsevier/Gold  Standard (2020-08-10 00:00:00)

## 2022-01-02 NOTE — Assessment & Plan Note (Signed)
Unfortunately trazodone was not effective for her.  We discussed starting Ambien 6.25 mg CR.  Counseled on side effects of Ambien and reasons which we should stop medication including unusual dreams, sleepwalking.  She understands not to take Ambien and alcohol at the same time.  Controlled substance contract has been signed.  Follow-up in 3 months

## 2022-01-02 NOTE — Progress Notes (Signed)
Subjective:    Patient ID: Ann Stevens, female    DOB: Apr 22, 1976, 46 y.o.   MRN: [id number]  CC: Ann Stevens is a 46 y.o. female who presents today for follow up.   HPI: Feels well today  No new complaints  She remains interested in weight loss medication.  She has an allergy to metformin.  Personal or family history of thyroid cancer   Prediabetes Insomnia- she tried trazodone '75mg'$  without relief.  She has trouble staying asleep.   HISTORY:  Past Medical History:  Diagnosis Date   Anemia, iron deficiency    Past Surgical History:  Procedure Laterality Date   CESAREAN SECTION  12/05/94,05/26/02   CHOLECYSTECTOMY N/A 07/10/2019   Procedure: LAPAROSCOPIC CHOLECYSTECTOMY WITH INTRAOPERATIVE CHOLANGIOGRAM;  Surgeon: Fredirick Maudlin, MD;  Location: [facility];  Service: General;  Laterality: N/A;   COLONOSCOPY WITH PROPOFOL N/A 05/01/2021   Procedure: COLONOSCOPY WITH PROPOFOL;  Surgeon: Virgel Manifold, MD;  Location: [facility];  Service: Endoscopy;  Laterality: N/A;   Family History  Problem Relation Age of Onset   Breast cancer Neg Hx    Colon cancer Neg Hx    Thyroid cancer Neg Hx     Allergies: Metformin and related Current Outpatient Medications on File Prior to Visit  Medication Sig Dispense Refill   cholecalciferol (VITAMIN D3) 25 MCG (1000 UNIT) tablet Take 1,000 Units by mouth daily.     ESTARYLLA 0.25-35 MG-MCG tablet TAKE 1 TABLET BY MOUTH DAILY 84 tablet 3   nystatin ointment (MYCOSTATIN) Apply 1 application topically 2 (two) times daily. 30 g 2   No current facility-administered medications on file prior to visit.    Social History   Tobacco Use   Smoking status: Never   Smokeless tobacco: Never  Vaping Use   Vaping Use: Never used  Substance Use Topics   Alcohol use: Yes    Comment: occ   Drug use: Never    Review of Systems  Constitutional:  Negative for chills and fever.  Respiratory:  Negative for cough.   Cardiovascular:   Negative for chest pain and palpitations.  Gastrointestinal:  Negative for nausea and vomiting.  Psychiatric/Behavioral:  Positive for sleep disturbance.       Objective:    BP 126/78 (BP Location: Right Arm, Patient Position: Sitting, Cuff Size: Normal)   Pulse 67   Temp 98.3 F (36.8 C) (Oral)   Ht '5\' 3"'$  (1.6 m)   Wt 205 lb 9.6 oz (93.3 kg)   LMP  (LMP Unknown)   SpO2 99%   BMI 36.42 kg/m  BP Readings from Last 3 Encounters:  01/02/22 126/78  07/08/21 118/82  05/01/21 119/64   Wt Readings from Last 3 Encounters:  01/02/22 205 lb 9.6 oz (93.3 kg)  07/08/21 202 lb 6.4 oz (91.8 kg)  05/01/21 195 lb 6.3 oz (88.6 kg)    Physical Exam Vitals reviewed.  Constitutional:      Appearance: She is well-developed.  Eyes:     Conjunctiva/sclera: Conjunctivae normal.  Cardiovascular:     Rate and Rhythm: Normal rate and regular rhythm.     Pulses: Normal pulses.     Heart sounds: Normal heart sounds.  Pulmonary:     Effort: Pulmonary effort is normal.     Breath sounds: Normal breath sounds. No wheezing, rhonchi or rales.  Skin:    General: Skin is warm and dry.  Neurological:     Mental Status: She is alert.  Psychiatric:  Speech: Speech normal.        Behavior: Behavior normal.        Thought Content: Thought content normal.        Assessment & Plan:   Problem List Items Addressed This Visit       Other   Insomnia    Unfortunately trazodone was not effective for her.  We discussed starting Ambien 6.25 mg CR.  Counseled on side effects of Ambien and reasons which we should stop medication including unusual dreams, sleepwalking.  She understands not to take Ambien and alcohol at the same time.  Controlled substance contract has been signed.  Follow-up in 3 months      Relevant Medications   zolpidem (AMBIEN CR) 6.25 MG CR tablet   Obesity (BMI 30.0-34.9)    Lab Results  Component Value Date   HGBA1C 6.2 (A) 07/08/2021  Fortunately unable to obtain [facility].  allergy to metformin.  We agreed trial of Saxenda is very reasonable in setting of prediabetes.  Counseled her on side effects administration medication.  Counseled her a black box warning as it relates to medullary thyroid cancer, multiple endocrine neoplasia.  She will let me know how she is doing      Relevant Medications   Liraglutide -Weight Management (SAXENDA) 18 MG/3ML SOPN   Routine physical examination - Primary   Relevant Orders   POCT HgB A1C     I am having Ann Stevens start on Saxenda and zolpidem. I am also having her maintain her cholecalciferol, nystatin ointment, and Estarylla.   Meds ordered this encounter  Medications   Liraglutide -Weight Management (SAXENDA) 18 MG/3ML SOPN    Sig: Inject 0.6 mg into the skin daily.    Dispense:  3 mL    Refill:  3    Order Specific Question:   Supervising Provider    Answer:   Deborra Medina L [[id number]]   zolpidem (AMBIEN CR) 6.25 MG CR tablet    Sig: Take 1 tablet (6.25 mg total) by mouth at bedtime as needed for sleep.    Dispense:  30 tablet    Refill:  2    Order Specific Question:   Supervising Provider    Answer:   Crecencio Mc [[id number]]    Return precautions given.   Risks, benefits, and alternatives of the medications and treatment plan prescribed today were discussed, and patient expressed understanding.   Education regarding symptom management and diagnosis given to patient on AVS.  Continue to follow with Burnard Hawthorne, FNP for routine health maintenance.   Ann Stevens and I agreed with plan.   Mable Paris, FNP

## 2022-01-04 ENCOUNTER — Other Ambulatory Visit: Payer: Self-pay | Admitting: Family

## 2022-01-04 DIAGNOSIS — G4700 Insomnia, unspecified: Secondary | ICD-10-CM

## 2022-01-06 ENCOUNTER — Ambulatory Visit: Admitting: Family

## 2022-01-07 ENCOUNTER — Encounter: Payer: Self-pay | Admitting: Family

## 2022-01-10 NOTE — Telephone Encounter (Signed)
Called patient to inform her that her insurance [employer] denied the PA for Saxenda again to see if she wanted to appeal it but she stated that we could just scheduled an appointment to maybe try a different alternative.

## 2023-04-09 ENCOUNTER — Encounter: Payer: Self-pay | Admitting: Family

## 2023-04-09 ENCOUNTER — Other Ambulatory Visit (HOSPITAL_COMMUNITY)
Admission: RE | Admit: 2023-04-09 | Discharge: 2023-04-09 | Disposition: A | Discharge status: Home / Self Care | Source: Ambulatory Visit | Attending: Family | Admitting: Family

## 2023-04-09 ENCOUNTER — Ambulatory Visit (INDEPENDENT_AMBULATORY_CARE_PROVIDER_SITE_OTHER): Admitting: Family

## 2023-04-09 VITALS — BP 126/82 | HR 75 | Temp 98.1°F | Ht 63.0 in | Wt 220.2 lb

## 2023-04-09 DIAGNOSIS — Z124 Encounter for screening for malignant neoplasm of cervix: Secondary | ICD-10-CM | POA: Diagnosis not present

## 2023-04-09 DIAGNOSIS — R7303 Prediabetes: Secondary | ICD-10-CM

## 2023-04-09 DIAGNOSIS — Z0000 Encounter for general adult medical examination without abnormal findings: Secondary | ICD-10-CM | POA: Insufficient documentation

## 2023-04-09 DIAGNOSIS — N921 Excessive and frequent menstruation with irregular cycle: Secondary | ICD-10-CM

## 2023-04-09 DIAGNOSIS — Z1231 Encounter for screening mammogram for malignant neoplasm of breast: Secondary | ICD-10-CM

## 2023-04-09 DIAGNOSIS — D508 Other iron deficiency anemias: Secondary | ICD-10-CM

## 2023-04-09 LAB — TSH: TSH: 2.18 u[IU]/mL (ref 0.35–5.50)

## 2023-04-09 LAB — LIPID PANEL
Cholesterol: 257 mg/dL — ABNORMAL HIGH (ref 0–200)
HDL: 51.1 mg/dL (ref >39.00)
LDL Cholesterol: 180 mg/dL — ABNORMAL HIGH (ref 0–99)
NonHDL: 206.14
Total CHOL/HDL Ratio: 5
Triglycerides: 132 mg/dL (ref 0.0–149.0)
VLDL: 26.4 mg/dL (ref 0.0–40.0)

## 2023-04-09 LAB — COMPREHENSIVE METABOLIC PANEL
ALT: 78 U/L — ABNORMAL HIGH (ref 0–35)
AST: 61 U/L — ABNORMAL HIGH (ref 0–37)
Albumin: 4.1 g/dL (ref 3.5–5.2)
Alkaline Phosphatase: 107 U/L (ref 39–117)
BUN: 6 mg/dL (ref 6–23)
CO2: 24 mEq/L (ref 19–32)
Calcium: 9.5 mg/dL (ref 8.4–10.5)
Chloride: 104 mEq/L (ref 96–112)
Creatinine, Ser: 0.67 mg/dL (ref 0.40–1.20)
GFR: 104.24 mL/min (ref >60.00)
Glucose, Bld: 149 mg/dL — ABNORMAL HIGH (ref 70–99)
Potassium: 3.9 mEq/L (ref 3.5–5.1)
Sodium: 139 mEq/L (ref 135–145)
Total Bilirubin: 0.3 mg/dL (ref 0.2–1.2)
Total Protein: 7.4 g/dL (ref 6.0–8.3)

## 2023-04-09 LAB — CBC WITH DIFFERENTIAL/PLATELET
Basophils Absolute: 0.1 10*3/uL (ref 0.0–0.1)
Basophils Relative: 0.7 % (ref 0.0–3.0)
Eosinophils Absolute: 0.5 10*3/uL (ref 0.0–0.7)
Eosinophils Relative: 6.1 % — ABNORMAL HIGH (ref 0.0–5.0)
HCT: 39 % (ref 36.0–46.0)
Hemoglobin: 12.3 g/dL (ref 12.0–15.0)
Lymphocytes Relative: 34.8 % (ref 12.0–46.0)
Lymphs Abs: 2.8 10*3/uL (ref 0.7–4.0)
MCHC: 31.6 g/dL (ref 30.0–36.0)
MCV: 86 fl (ref 78.0–100.0)
Monocytes Absolute: 0.6 10*3/uL (ref 0.1–1.0)
Monocytes Relative: 7.2 % (ref 3.0–12.0)
Neutro Abs: 4.1 10*3/uL (ref 1.4–7.7)
Neutrophils Relative %: 51.2 % (ref 43.0–77.0)
Platelets: 312 10*3/uL (ref 150.0–400.0)
RBC: 4.54 Mil/uL (ref 3.87–5.11)
RDW: 14.7 % (ref 11.5–15.5)
WBC: 8 10*3/uL (ref 4.0–10.5)

## 2023-04-09 LAB — LDL CHOLESTEROL, DIRECT: Direct LDL: 195 mg/dL

## 2023-04-09 LAB — HEMOGLOBIN A1C: Hgb A1c MFr Bld: 7.6 % — ABNORMAL HIGH (ref 4.6–6.5)

## 2023-04-09 MED ORDER — NORGESTIMATE-ETH ESTRADIOL 0.25-35 MG-MCG PO TABS
1.0000 | ORAL_TABLET | Freq: Every day | ORAL | 3 refills | Status: DC
Start: 2023-04-09 — End: 2023-07-24

## 2023-04-09 NOTE — Assessment & Plan Note (Addendum)
Clinical breast exam performed, Pap smear obtained.  Patient will schedule mammogram .  Provided refill of birth control in the absence of contraindications for use.

## 2023-04-09 NOTE — Progress Notes (Signed)
Assessment & Plan:  Routine physical examination Assessment & Plan: Clinical breast exam performed, Pap smear obtained.  Patient will schedule mammogram .  Provided refill of birth control in the absence of contraindications for use.  Orders: -     3D Screening Mammogram, Left and Right; Future -     Cytology - PAP  Prediabetes -     Comprehensive metabolic panel -     LDL cholesterol, direct -     Lipid panel -     TSH -     Hemoglobin A1c  Other iron deficiency anemia -     CBC with Differential/Platelet -     Iron, TIBC and Ferritin Panel  Encounter for screening mammogram for malignant neoplasm of breast -     3D Screening Mammogram, Left and Right; Future  Screening for cervical cancer -     Cytology - PAP  Menorrhagia with irregular cycle -     Norgestimate-Eth Estradiol; Take 1 tablet by mouth daily.  Dispense: 84 tablet; Refill: 3     Return precautions given.   Risks, benefits, and alternatives of the medications and treatment plan prescribed today were discussed, and patient expressed understanding.   Education regarding symptom management and diagnosis given to patient on AVS either electronically or printed.  No follow-ups on file.  Rennie Plowman, FNP  Subjective:    Patient ID: Ann Stevens, female    DOB: [birth year], 47 y.o.   MRN: [id number]  CC: Ann Stevens is a 47 y.o. female who presents today for physical exam.    HPI: Feels well today.  No new complaints.  She requests a refill of birth control.  No history of DVT.  Menses are monthly, regular   Very rare use of ambien 6.25 CR.   She is taking daily iron ( unsure of dose) due to history of iron deficient anemia  Colorectal Cancer Screening: UTD,  04/2021; repeat in 7 yrs Breast Cancer Screening: Mammogram due Cervical Cancer Screening: Obtained 08/11/2019 negative malignancy, benign reactive/reparative changes, negative HPV  Bone Health screening/DEXA for 65+: No increased  fracture risk. Defer screening at this time.  Lung Cancer Screening: Doesn't have 20 year pack year history and age > 16 years yo 60 years        Tetanus - Due; declines  Exercise: No regular exercise.   Alcohol use: Occasional Smoking/tobacco use: Nonsmoker.    Health Maintenance  Topic Date Due   DTaP/Tdap/Td vaccine (1 - Tdap) Never done   COVID-19 Vaccine (3 - 2023-24 season) 03/22/2023   Flu Shot  10/19/2023*   Pap with HPV screening  08/10/2024   Colon Cancer Screening  05/01/2028   HIV Screening  Completed   HPV Vaccine  Aged Out   Hepatitis C Screening  Discontinued  *Topic was postponed. The date shown is not the original due date.    ALLERGIES: Metformin and related  Current Outpatient Medications on File Prior to Visit  Medication Sig Dispense Refill   cholecalciferol (VITAMIN D3) 25 MCG (1000 UNIT) tablet Take 1,000 Units by mouth daily.     zolpidem (AMBIEN CR) 6.25 MG CR tablet Take 1 tablet (6.25 mg total) by mouth at bedtime as needed for sleep. 30 tablet 2   No current facility-administered medications on file prior to visit.    Review of Systems  Constitutional:  Negative for chills, fever and unexpected weight change.  HENT:  Negative for congestion.   Respiratory:  Negative for cough.  Cardiovascular:  Negative for chest pain, palpitations and leg swelling.  Gastrointestinal:  Negative for nausea and vomiting.  Musculoskeletal:  Negative for arthralgias and myalgias.  Skin:  Negative for rash.  Neurological:  Negative for headaches.  Hematological:  Negative for adenopathy.  Psychiatric/Behavioral:  Negative for confusion.       Objective:    BP 126/82   Pulse 75   Temp 98.1 F (36.7 C) (Oral)   Ht 5\' 3"  (1.6 m)   Wt 220 lb 3.2 oz (99.9 kg)   SpO2 98%   BMI 39.01 kg/m   BP Readings from Last 3 Encounters:  04/09/23 126/82  01/02/22 126/78  07/08/21 118/82   Wt Readings from Last 3 Encounters:  04/09/23 220 lb 3.2 oz (99.9 kg)   01/02/22 205 lb 9.6 oz (93.3 kg)  07/08/21 202 lb 6.4 oz (91.8 kg)    Physical Exam Vitals reviewed.  Constitutional:      Appearance: Normal appearance. She is well-developed.  Eyes:     Conjunctiva/sclera: Conjunctivae normal.  Neck:     Thyroid: No thyroid mass or thyromegaly.  Cardiovascular:     Rate and Rhythm: Normal rate and regular rhythm.     Pulses: Normal pulses.     Heart sounds: Normal heart sounds.  Pulmonary:     Effort: Pulmonary effort is normal.     Breath sounds: Normal breath sounds. No wheezing, rhonchi or rales.  Chest:  Breasts:    Breasts are symmetrical.     Right: No inverted nipple, mass, nipple discharge, skin change or tenderness.     Left: No inverted nipple, mass, nipple discharge, skin change or tenderness.  Abdominal:     General: Bowel sounds are normal. There is no distension.     Palpations: Abdomen is soft. Abdomen is not rigid. There is no fluid wave or mass.     Tenderness: There is no abdominal tenderness. There is no guarding or rebound.  Genitourinary:    Cervix: No cervical motion tenderness, discharge or friability.     Uterus: Not enlarged, not fixed and not tender.      Adnexa:        Right: No mass, tenderness or fullness.         Left: No mass, tenderness or fullness.       Comments: Pap performed. No CMT. Unable to appreciated ovaries. Lymphadenopathy:     Head:     Right side of head: No submental, submandibular, tonsillar, preauricular, posterior auricular or occipital adenopathy.     Left side of head: No submental, submandibular, tonsillar, preauricular, posterior auricular or occipital adenopathy.     Cervical:     Right cervical: No superficial, deep or posterior cervical adenopathy.    Left cervical: No superficial, deep or posterior cervical adenopathy.     Upper Body:     Right upper body: No pectoral adenopathy.     Left upper body: No pectoral adenopathy.  Skin:    General: Skin is warm and dry.   Neurological:     Mental Status: She is alert.  Psychiatric:        Speech: Speech normal.        Behavior: Behavior normal.        Thought Content: Thought content normal.

## 2023-04-09 NOTE — Patient Instructions (Addendum)
Please call  and schedule your 3D mammogram and /or bone density scan as we discussed.   [facility]  ( new location in 2023)  [address], [place], [place] [address]  [place], [place]  [phone]   Health Maintenance, Female Adopting a healthy lifestyle and getting preventive care are important in promoting health and wellness. Ask your health care provider about: The right schedule for you to have regular tests and exams. Things you can do on your own to prevent diseases and keep yourself healthy. What should I know about diet, weight, and exercise? Eat a healthy diet  Eat a diet that includes plenty of vegetables, fruits, low-fat dairy products, and lean protein. Do not eat a lot of foods that are high in solid fats, added sugars, or sodium. Maintain a healthy weight Body mass index (BMI) is used to identify weight problems. It estimates body fat based on height and weight. Your health care provider can help determine your BMI and help you achieve or maintain a healthy weight. Get regular exercise Get regular exercise. This is one of the most important things you can do for your health. Most adults should: Exercise for at least 150 minutes each week. The exercise should increase your heart rate and make you sweat (moderate-intensity exercise). Do strengthening exercises at least twice a week. This is in addition to the moderate-intensity exercise. Spend less time sitting. Even light physical activity can be beneficial. Watch cholesterol and blood lipids Have your blood tested for lipids and cholesterol at 47 years of age, then have this test every 5 years. Have your cholesterol levels checked more often if: Your lipid or cholesterol levels are high. You are older than 47 years of age. You are at high risk for heart disease. What should I know about cancer screening? Depending on your health history and family history, you may need to have cancer screening  at various ages. This may include screening for: Breast cancer. Cervical cancer. Colorectal cancer. Skin cancer. Lung cancer. What should I know about heart disease, diabetes, and high blood pressure? Blood pressure and heart disease High blood pressure causes heart disease and increases the risk of stroke. This is more likely to develop in people who have high blood pressure readings or are overweight. Have your blood pressure checked: Every 3-5 years if you are 5-44 years of age. Every year if you are 47 years old or older. Diabetes Have regular diabetes screenings. This checks your fasting blood sugar level. Have the screening done: Once every three years after age 20 if you are at a normal weight and have a low risk for diabetes. More often and at a younger age if you are overweight or have a high risk for diabetes. What should I know about preventing infection? Hepatitis B If you have a higher risk for hepatitis B, you should be screened for this virus. Talk with your health care provider to find out if you are at risk for hepatitis B infection. Hepatitis C Testing is recommended for: Everyone born from [birth year] through 1965. Anyone with known risk factors for hepatitis C. Sexually transmitted infections (STIs) Get screened for STIs, including gonorrhea and chlamydia, if: You are sexually active and are younger than 47 years of age. You are older than 47 years of age and your health care provider tells you that you are at risk for this type of infection. Your sexual activity has changed since you were last screened, and you are  at increased risk for chlamydia or gonorrhea. Ask your health care provider if you are at risk. Ask your health care provider about whether you are at high risk for HIV. Your health care provider may recommend a prescription medicine to help prevent HIV infection. If you choose to take medicine to prevent HIV, you should first get tested for HIV. You should then  be tested every 3 months for as long as you are taking the medicine. Pregnancy If you are about to stop having your period (premenopausal) and you may become pregnant, seek counseling before you get pregnant. Take 400 to 800 micrograms (mcg) of folic acid every day if you become pregnant. Ask for birth control (contraception) if you want to prevent pregnancy. Osteoporosis and menopause Osteoporosis is a disease in which the bones lose minerals and strength with aging. This can result in bone fractures. If you are 42 years old or older, or if you are at risk for osteoporosis and fractures, ask your health care provider if you should: Be screened for bone loss. Take a calcium or vitamin D supplement to lower your risk of fractures. Be given hormone replacement therapy (HRT) to treat symptoms of menopause. Follow these instructions at home: Alcohol use Do not drink alcohol if: Your health care provider tells you not to drink. You are pregnant, may be pregnant, or are planning to become pregnant. If you drink alcohol: Limit how much you have to: 0-1 drink a day. Know how much alcohol is in your drink. In the [place] one drink equals one 12 oz bottle of beer (355 mL), one 5 oz glass of wine (148 mL), or one 1 oz glass of hard liquor (44 mL). Lifestyle Do not use any products that contain nicotine or tobacco. These products include cigarettes, chewing tobacco, and vaping devices, such as e-cigarettes. If you need help quitting, ask your health care provider. Do not use street drugs. Do not share needles. Ask your health care provider for help if you need support or information about quitting drugs. General instructions Schedule regular health, dental, and eye exams. Stay current with your vaccines. Tell your health care provider if: You often feel depressed. You have ever been abused or do not feel safe at home. Summary Adopting a healthy lifestyle and getting preventive care are important in  promoting health and wellness. Follow your health care provider's instructions about healthy diet, exercising, and getting tested or screened for diseases. Follow your health care provider's instructions on monitoring your cholesterol and blood pressure. This information is not intended to replace advice given to you by your health care provider. Make sure you discuss any questions you have with your health care provider. Document Revised: 11/26/2020 Document Reviewed: 11/26/2020 Elsevier Patient Education  2024 [employer].

## 2023-04-10 LAB — IRON,TIBC AND FERRITIN PANEL
%SAT: 20 % (calc) (ref 16–45)
Ferritin: 104 ng/mL (ref 16–232)
Iron: 61 ug/dL (ref 40–190)
TIBC: 300 mcg/dL (calc) (ref 250–450)

## 2023-04-14 LAB — CYTOLOGY - PAP
Adequacy: ABSENT
Chlamydia: NEGATIVE
Comment: NEGATIVE
Comment: NEGATIVE
Comment: NEGATIVE
Comment: NORMAL
Diagnosis: NEGATIVE
High risk HPV: NEGATIVE
Neisseria Gonorrhea: NEGATIVE
Trichomonas: NEGATIVE

## 2023-04-23 ENCOUNTER — Encounter: Payer: Self-pay | Admitting: Family

## 2023-04-23 ENCOUNTER — Ambulatory Visit (INDEPENDENT_AMBULATORY_CARE_PROVIDER_SITE_OTHER): Admitting: Family

## 2023-04-23 VITALS — BP 130/78 | HR 77 | Temp 98.7°F | Ht 63.0 in | Wt 218.0 lb

## 2023-04-23 DIAGNOSIS — E119 Type 2 diabetes mellitus without complications: Secondary | ICD-10-CM

## 2023-04-23 DIAGNOSIS — R899 Unspecified abnormal finding in specimens from other organs, systems and tissues: Secondary | ICD-10-CM

## 2023-04-23 DIAGNOSIS — Z7985 Long-term (current) use of injectable non-insulin antidiabetic drugs: Secondary | ICD-10-CM | POA: Diagnosis not present

## 2023-04-23 DIAGNOSIS — E785 Hyperlipidemia, unspecified: Secondary | ICD-10-CM | POA: Diagnosis not present

## 2023-04-23 HISTORY — DX: Hyperlipidemia, unspecified: E78.5

## 2023-04-23 LAB — MICROALBUMIN / CREATININE URINE RATIO
Creatinine,U: 231.6 mg/dL
Microalb Creat Ratio: 1.1 mg/g (ref 0.0–30.0)
Microalb, Ur: 2.6 mg/dL — ABNORMAL HIGH (ref 0.0–1.9)

## 2023-04-23 MED ORDER — SEMAGLUTIDE(0.25 OR 0.5MG/DOS) 2 MG/3ML ~~LOC~~ SOPN
0.2500 mg | PEN_INJECTOR | SUBCUTANEOUS | 2 refills | Status: DC
Start: 2023-04-23 — End: 2023-07-24

## 2023-04-23 NOTE — Assessment & Plan Note (Addendum)
Lab Results  Component Value Date   HGBA1C 7.6 (H) 04/09/2023  Patient previously had a rash on metformin.  We discussed Ozempic as it relates to blackbox warning regarding medullary thyroid cancer, multiple endocrine neoplasia, side effects and titration.  Patient agreeable to trial Ozempic.  She will monitor for constipation.  Encouraged to have diabetic eye exam. Discussed resumption of Crestor however in the setting of elevation in ALT AST, we opted to repeat liver enzymes today and discuss LDL at follow up. Discussed goal in setting of DM of LDL < 70.

## 2023-04-23 NOTE — Patient Instructions (Signed)
Start ozempic 0.67m once per week once per week injected subcutaneously ( [place])  in stomach. Please clean with alcohol swab prior to injection and be sure to rotate site. You may schedule a nurse visit if you would like to first injection.   After 4 weeks, and if tolerated and weight loss has not reached 1-2 lbs per week, please increase to 0.574monce per week .    Please read information on medication below and remember black box warning that you may not take if you or a family member is diagnosed with thyroid cancer (medullary thyroid cancer), or multiple endocrine neoplasia ( MEN).       Semaglutide injection solution What is this medicine? SEMAGLUTIDE (Sem a GLOO tide) is used to improve blood sugar control in adults with type 2 diabetes. This medicine may be used with other diabetes medicines. This drug may also reduce the risk of heart attack or stroke if you have type 2 diabetes and risk factors for heart disease. This medicine may be used for other purposes; ask your health care provider or pharmacist if you have questions. COMMON BRAND NAME(S): OZEMPIC What should I tell my health care provider before I take this medicine? They need to know if you have any of these conditions: endocrine tumors (MEN 2) or if someone in your family had these tumors eye disease, vision problems history of pancreatitis kidney disease stomach problems thyroid cancer or if someone in your family had thyroid cancer an unusual or allergic reaction to semaglutide, other medicines, foods, dyes, or preservatives pregnant or trying to get pregnant breast-feeding How should I use this medicine? This medicine is for injection under the skin of your upper leg (thigh), stomach area, or upper arm. It is given once every week (every 7 days). You will be taught how to prepare and give this medicine. Use exactly as directed. Take your medicine at regular intervals. Do not take it more often than directed. If you use  this medicine with insulin, you should inject this medicine and the insulin separately. Do not mix them together. Do not give the injections right next to each other. Change (rotate) injection sites with each injection. It is important that you put your used needles and syringes in a special sharps container. Do not put them in a trash can. If you do not have a sharps container, call your pharmacist or healthcare provider to get one. A special MedGuide will be given to you by the pharmacist with each prescription and refill. Be sure to read this information carefully each time. This drug comes with INSTRUCTIONS FOR USE. Ask your pharmacist for directions on how to use this drug. Read the information carefully. Talk to your pharmacist or health care provider if you have questions. Talk to your pediatrician regarding the use of this medicine in children. Special care may be needed. Overdosage: If you think you have taken too much of this medicine contact a poison control center or emergency room at once. NOTE: This medicine is only for you. Do not share this medicine with others. What if I miss a dose? If you miss a dose, take it as soon as you can within 5 days after the missed dose. Then take your next dose at your regular weekly time. If it has been longer than 5 days after the missed dose, do not take the missed dose. Take the next dose at your regular time. Do not take double or extra doses. If you have questions  about a missed dose, contact your health care provider for advice. What may interact with this medicine? other medicines for diabetes Many medications may cause changes in blood sugar, these include: alcohol containing beverages antiviral medicines for HIV or AIDS aspirin and aspirin-like drugs certain medicines for blood pressure, heart disease, irregular heart beat chromium diuretics female hormones, such as estrogens or progestins, birth control  pills fenofibrate gemfibrozil isoniazid lanreotide female hormones or anabolic steroids MAOIs like Carbex, Eldepryl, Marplan, Nardil, and Parnate medicines for weight loss medicines for allergies, asthma, cold, or cough medicines for depression, anxiety, or psychotic disturbances niacin nicotine NSAIDs, medicines for pain and inflammation, like ibuprofen or naproxen octreotide pasireotide pentamidine phenytoin probenecid quinolone antibiotics such as ciprofloxacin, levofloxacin, ofloxacin some herbal dietary supplements steroid medicines such as prednisone or cortisone sulfamethoxazole; trimethoprim thyroid hormones Some medications can hide the warning symptoms of low blood sugar (hypoglycemia). You may need to monitor your blood sugar more closely if you are taking one of these medications. These include: beta-blockers, often used for high blood pressure or heart problems (examples include atenolol, metoprolol, propranolol) clonidine guanethidine reserpine This list may not describe all possible interactions. Give your health care provider a list of all the medicines, herbs, non-prescription drugs, or dietary supplements you use. Also tell them if you smoke, drink alcohol, or use illegal drugs. Some items may interact with your medicine. What should I watch for while using this medicine? Visit your doctor or health care professional for regular checks on your progress. Drink plenty of fluids while taking this medicine. Check with your doctor or health care professional if you get an attack of severe diarrhea, nausea, and vomiting. The loss of too much body fluid can make it dangerous for you to take this medicine. A test called the HbA1C (A1C) will be monitored. This is a simple blood test. It measures your blood sugar control over the last 2 to 3 months. You will receive this test every 3 to 6 months. Learn how to check your blood sugar. Learn the symptoms of low and high blood  sugar and how to manage them. Always carry a quick-source of sugar with you in case you have symptoms of low blood sugar. Examples include hard sugar candy or glucose tablets. Make sure others know that you can choke if you eat or drink when you develop serious symptoms of low blood sugar, such as seizures or unconsciousness. They must get medical help at once. Tell your doctor or health care professional if you have high blood sugar. You might need to change the dose of your medicine. If you are sick or exercising more than usual, you might need to change the dose of your medicine. Do not skip meals. Ask your doctor or health care professional if you should avoid alcohol. Many nonprescription cough and cold products contain sugar or alcohol. These can affect blood sugar. Pens should never be shared. Even if the needle is changed, sharing may result in passing of viruses like hepatitis or HIV. Wear a medical ID bracelet or chain, and carry a card that describes your disease and details of your medicine and dosage times. Do not become pregnant while taking this medicine. Women should inform their doctor if they wish to become pregnant or think they might be pregnant. There is a potential for serious side effects to an unborn child. Talk to your health care professional or pharmacist for more information. What side effects may I notice from receiving this medicine? Side effects  that you should report to your doctor or health care professional as soon as possible: allergic reactions like skin rash, itching or hives, swelling of the face, lips, or tongue breathing problems changes in vision diarrhea that continues or is severe lump or swelling on the neck severe nausea signs and symptoms of infection like fever or chills; cough; sore throat; pain or trouble passing urine signs and symptoms of low blood sugar such as feeling anxious, confusion, dizziness, increased hunger, unusually weak or tired,  sweating, shakiness, cold, irritable, headache, blurred vision, fast heartbeat, loss of consciousness signs and symptoms of kidney injury like trouble passing urine or change in the amount of urine trouble swallowing unusual stomach upset or pain vomiting Side effects that usually do not require medical attention (report to your doctor or health care professional if they continue or are bothersome): constipation diarrhea nausea pain, redness, or irritation at site where injected stomach upset This list may not describe all possible side effects. Call your doctor for medical advice about side effects. You may report side effects to FDA at 1-800-FDA-1088. Where should I keep my medicine? Keep out of the reach of children. Store unopened pens in a refrigerator between 2 and 8 degrees C (36 and 46 degrees F). Do not freeze. Protect from light and heat. After you first use the pen, it can be stored for 56 days at room temperature between 15 and 30 degrees C (59 and 86 degrees F) or in a refrigerator. Throw away your used pen after 56 days or after the expiration date, whichever comes first. Do not store your pen with the needle attached. If the needle is left on, medicine may leak from the pen. NOTE: This sheet is a summary. It may not cover all possible information. If you have questions about this medicine, talk to your doctor, pharmacist, or health care provider.  2021 Elsevier/Gold Standard (2019-03-22 09:41:51)

## 2023-04-23 NOTE — Progress Notes (Signed)
Assessment & Plan:  Diabetes mellitus without complication [facility]) Assessment & Plan: Lab Results  Component Value Date   HGBA1C 7.6 (H) 04/09/2023  Patient previously had a rash on metformin.  We discussed Ozempic as it relates to blackbox warning regarding medullary thyroid cancer, multiple endocrine neoplasia, side effects and titration.  Patient agreeable to trial Ozempic.  She will monitor for constipation.  Encouraged to have diabetic eye exam. Discussed resumption of Crestor however in the setting of elevation in ALT AST, we opted to repeat liver enzymes today and discuss LDL at follow up. Discussed goal in setting of DM of LDL < 70.    Orders: -     Semaglutide(0.25 or 0.5MG /DOS); Inject 0.25 mg into the skin once a week.  Dispense: 3 mL; Refill: 2 -     Microalbumin / creatinine urine ratio  Abnormal laboratory test -     CBC with Differential/Platelet -     Comprehensive metabolic panel  Hyperlipidemia, unspecified hyperlipidemia type     Return precautions given.   Risks, benefits, and alternatives of the medications and treatment plan prescribed today were discussed, and patient expressed understanding.   Education regarding symptom management and diagnosis given to patient on AVS either electronically or printed.  Return in about 3 months (around 07/24/2023).  Rennie Plowman, FNP  Subjective:    Patient ID: Ann Stevens, female    DOB: 05-Jan-1976, 47 y.o.   MRN: [id number]  CC: Ann Stevens is a 47 y.o. female who presents today for follow up.   HPI: Feels well today.  No new complaints.  Here to discuss new diagnosis of diabetes.  Denies alcohol or Tylenol use.   She had been on crestor  in the past.   Metformin caused a rash.  She was unable to try [place], [place] in the past due to insurance/backorder.   No personal or family history of thyroid cancer.   Allergies: Metformin and related Current Outpatient Medications on File Prior to Visit   Medication Sig Dispense Refill   cholecalciferol (VITAMIN D3) 25 MCG (1000 UNIT) tablet Take 1,000 Units by mouth daily.     norgestimate-ethinyl estradiol (ESTARYLLA) 0.25-35 MG-MCG tablet Take 1 tablet by mouth daily. 84 tablet 3   zolpidem (AMBIEN CR) 6.25 MG CR tablet Take 1 tablet (6.25 mg total) by mouth at bedtime as needed for sleep. 30 tablet 2   No current facility-administered medications on file prior to visit.    Review of Systems  Constitutional:  Negative for chills and fever.  Respiratory:  Negative for cough.   Cardiovascular:  Negative for chest pain and palpitations.  Gastrointestinal:  Negative for nausea and vomiting.      Objective:    BP 130/78   Pulse 77   Temp 98.7 F (37.1 C) (Oral)   Ht 5\' 3"  (1.6 m)   Wt 218 lb (98.9 kg)   LMP  (LMP Unknown)   SpO2 96%   BMI 38.62 kg/m  BP Readings from Last 3 Encounters:  04/23/23 130/78  04/09/23 126/82  01/02/22 126/78   Wt Readings from Last 3 Encounters:  04/23/23 218 lb (98.9 kg)  04/09/23 220 lb 3.2 oz (99.9 kg)  01/02/22 205 lb 9.6 oz (93.3 kg)    Physical Exam Vitals reviewed.  Constitutional:      Appearance: She is well-developed.  Eyes:     Conjunctiva/sclera: Conjunctivae normal.  Cardiovascular:     Rate and Rhythm: Normal rate and regular rhythm.  Pulses: Normal pulses.     Heart sounds: Normal heart sounds.  Pulmonary:     Effort: Pulmonary effort is normal.     Breath sounds: Normal breath sounds. No wheezing, rhonchi or rales.  Skin:    General: Skin is warm and dry.  Neurological:     Mental Status: She is alert.  Psychiatric:        Speech: Speech normal.        Behavior: Behavior normal.        Thought Content: Thought content normal.

## 2023-04-24 ENCOUNTER — Encounter: Payer: Self-pay | Admitting: Family

## 2023-04-27 ENCOUNTER — Telehealth: Payer: Self-pay

## 2023-04-27 ENCOUNTER — Other Ambulatory Visit (HOSPITAL_COMMUNITY): Payer: Self-pay

## 2023-04-27 NOTE — Telephone Encounter (Signed)
Pharmacy Patient Advocate Encounter   Received notification from Patient Advice Request messages that prior authorization for Ozempic (0.25 or 0.5 MG/DOSE) 2MG /3ML pen-injectors is required/requested.   Insurance verification completed.   The patient is insured through [employer] .   Per test claim: APPROVED from 03/28/23 to 07/20/2098. Ran test claim, Copay is $76. This test claim was processed through [facility]- copay amounts may vary at other pharmacies due to pharmacy/plan contracts, or as the patient moves through the different stages of their insurance plan.

## 2023-06-23 ENCOUNTER — Encounter

## 2023-07-06 ENCOUNTER — Encounter: Payer: Self-pay | Admitting: Family

## 2023-07-06 NOTE — Telephone Encounter (Signed)
 Care team updated and letter sent for eye exam notes.

## 2023-07-24 ENCOUNTER — Ambulatory Visit: Admitting: Family

## 2023-07-24 ENCOUNTER — Encounter: Payer: Self-pay | Admitting: Family

## 2023-07-24 VITALS — BP 128/78 | HR 78 | Temp 98.4°F | Ht 64.0 in | Wt 193.4 lb

## 2023-07-24 DIAGNOSIS — L989 Disorder of the skin and subcutaneous tissue, unspecified: Secondary | ICD-10-CM | POA: Insufficient documentation

## 2023-07-24 DIAGNOSIS — E119 Type 2 diabetes mellitus without complications: Secondary | ICD-10-CM | POA: Diagnosis not present

## 2023-07-24 DIAGNOSIS — Z3009 Encounter for other general counseling and advice on contraception: Secondary | ICD-10-CM | POA: Insufficient documentation

## 2023-07-24 DIAGNOSIS — Z7985 Long-term (current) use of injectable non-insulin antidiabetic drugs: Secondary | ICD-10-CM

## 2023-07-24 DIAGNOSIS — R7309 Other abnormal glucose: Secondary | ICD-10-CM

## 2023-07-24 DIAGNOSIS — E049 Nontoxic goiter, unspecified: Secondary | ICD-10-CM | POA: Diagnosis not present

## 2023-07-24 DIAGNOSIS — N921 Excessive and frequent menstruation with irregular cycle: Secondary | ICD-10-CM | POA: Diagnosis not present

## 2023-07-24 LAB — COMPREHENSIVE METABOLIC PANEL
ALT: 16 U/L (ref 0–35)
AST: 16 U/L (ref 0–37)
Albumin: 4.1 g/dL (ref 3.5–5.2)
Alkaline Phosphatase: 87 U/L (ref 39–117)
BUN: 6 mg/dL (ref 6–23)
CO2: 27 meq/L (ref 19–32)
Calcium: 9.6 mg/dL (ref 8.4–10.5)
Chloride: 103 meq/L (ref 96–112)
Creatinine, Ser: 0.69 mg/dL (ref 0.40–1.20)
GFR: 103.3 mL/min (ref >60.00)
Glucose, Bld: 97 mg/dL (ref 70–99)
Potassium: 4.1 meq/L (ref 3.5–5.1)
Sodium: 138 meq/L (ref 135–145)
Total Bilirubin: 0.3 mg/dL (ref 0.2–1.2)
Total Protein: 7.8 g/dL (ref 6.0–8.3)

## 2023-07-24 LAB — CBC WITH DIFFERENTIAL/PLATELET
Basophils Absolute: 0.1 10*3/uL (ref 0.0–0.1)
Basophils Relative: 0.6 % (ref 0.0–3.0)
Eosinophils Absolute: 0.3 10*3/uL (ref 0.0–0.7)
Eosinophils Relative: 3.5 % (ref 0.0–5.0)
HCT: 38.5 % (ref 36.0–46.0)
Hemoglobin: 12.6 g/dL (ref 12.0–15.0)
Lymphocytes Relative: 28.5 % (ref 12.0–46.0)
Lymphs Abs: 2.7 10*3/uL (ref 0.7–4.0)
MCHC: 32.6 g/dL (ref 30.0–36.0)
MCV: 84.8 fL (ref 78.0–100.0)
Monocytes Absolute: 0.5 10*3/uL (ref 0.1–1.0)
Monocytes Relative: 5.7 % (ref 3.0–12.0)
Neutro Abs: 5.8 10*3/uL (ref 1.4–7.7)
Neutrophils Relative %: 61.7 % (ref 43.0–77.0)
Platelets: 341 10*3/uL (ref 150.0–400.0)
RBC: 4.54 Mil/uL (ref 3.87–5.11)
RDW: 14.3 % (ref 11.5–15.5)
WBC: 9.4 10*3/uL (ref 4.0–10.5)

## 2023-07-24 LAB — POCT GLYCOSYLATED HEMOGLOBIN (HGB A1C): Hemoglobin A1C: 6.3 % — AB (ref 4.0–5.6)

## 2023-07-24 LAB — LIPID PANEL
Cholesterol: 215 mg/dL — ABNORMAL HIGH (ref 0–200)
HDL: 56.6 mg/dL (ref >39.00)
LDL Cholesterol: 128 mg/dL — ABNORMAL HIGH (ref 0–99)
NonHDL: 158.24
Total CHOL/HDL Ratio: 4
Triglycerides: 150 mg/dL — ABNORMAL HIGH (ref 0.0–149.0)
VLDL: 30 mg/dL (ref 0.0–40.0)

## 2023-07-24 MED ORDER — NORGESTIMATE-ETH ESTRADIOL 0.25-35 MG-MCG PO TABS: 1.0000 | ORAL_TABLET | Freq: Every day | ORAL | 3 refills | Status: DC

## 2023-07-24 MED ORDER — SEMAGLUTIDE(0.25 OR 0.5MG/DOS) 2 MG/3ML ~~LOC~~ SOPN
0.5000 mg | PEN_INJECTOR | SUBCUTANEOUS | 2 refills | Status: DC
Start: 2023-07-24 — End: 2023-11-23

## 2023-07-24 NOTE — Assessment & Plan Note (Signed)
 Excellent control.  Patient tolerating Ozempic well.  She prefers to stay on Ozempic 0.5 mg once weekly.  Will continue

## 2023-07-24 NOTE — Assessment & Plan Note (Signed)
 Thoracic skin lesion consistent with seborrheic keratosis.  Discussed acanthosis nigricans nape of neck in the setting of diabetes.  Referral dermatology for formal skin evaluation.

## 2023-07-24 NOTE — Patient Instructions (Addendum)
  You may also trial melatonin combination over the counter supplement such as Sleep#3 or Qunol Sleep 5 in 1  For your skin, suspect upper back lesion is a seborrheic keratosis which is a benign stuck on lesion.  We also discussed acanthosis nigricans along your neck.  This is commonly seen in insulin resistance, diabetes.  I placed a referral to dermatology for annual skin exam  Let us  know if you dont hear back within a week in regards to an appointment being scheduled.   So that you are aware, if you are [facility] MyChart user , please pay attention to your MyChart messages as you may receive a MyChart message with a phone number to call and schedule this test/appointment own your own from our referral coordinator. This is a new process so I do not want you to miss this message.  If you are not a MyChart user, you will receive a phone call.

## 2023-07-24 NOTE — Assessment & Plan Note (Signed)
 Menses regular. No contraindications for use of birth control at this time.  Will continue

## 2023-07-24 NOTE — Progress Notes (Signed)
 Assessment & Plan:  Elevated glucose -     POCT glycosylated hemoglobin (Hb A1C) -     Microalbumin / creatinine urine ratio; Future  Diabetes mellitus without complication (HCC) Assessment & Plan: Excellent control.  Patient tolerating Ozempic  well.  She prefers to stay on Ozempic  0.5 mg once weekly.  Will continue  Orders: -     CBC with Differential/Platelet -     Comprehensive metabolic panel -     Semaglutide (0.25 or 0.5MG /DOS); Inject 0.5 mg into the skin once a week.  Dispense: 3 mL; Refill: 2 -     Lipid panel  Menorrhagia with irregular cycle -     Norgestimate -Eth Estradiol ; Take 1 tablet by mouth daily.  Dispense: 84 tablet; Refill: 3  Skin lesion Assessment & Plan: Thoracic skin lesion consistent with seborrheic keratosis.  Discussed acanthosis nigricans nape of neck in the setting of diabetes.  Referral dermatology for formal skin evaluation.  Orders: -     Ambulatory referral to Dermatology  Enlarged thyroid  -     US  THYROID ; Future  Birth control counseling Assessment & Plan: Menses regular. No contraindications for use of birth control at this time.  Will continue      Return precautions given.   Risks, benefits, and alternatives of the medications and treatment plan prescribed today were discussed, and patient expressed understanding.   Education regarding symptom management and diagnosis given to patient on AVS either electronically or printed.  Return in about 4 months (around 11/21/2023).  Rollene Northern, FNP  Subjective:    Patient ID: Ann Stevens, female    DOB: 1975/09/14, 48 y.o.   MRN: [id number]  CC: Ann Stevens is a 48 y.o. female who presents today for follow up.   HPI: Compliant with ozempic  with 0.5mg  and recently decreased to 0.25mg .   Goal weight 180lbs  She is losing 1lb/week and pleased with medication  Eating more protein, vegetables.   Denies constipation, nausea.     No h/o ASCVD   No history of DVT,  migraine with aura. No history of cancer. Patient states she's not pregnant or breast-feeding.No concern for STDs.   Menses are regular.   She has noticed painless upper back thoracic brown lesion.  Nonbleeding no itching.  It feels hot under her shirt as raised.   No history of skin cancer  Sleep has improved.  She is not taking Ambien  regularly.    Allergies: Metformin  and related Current Outpatient Medications on File Prior to Visit  Medication Sig Dispense Refill   cholecalciferol (VITAMIN D3) 25 MCG (1000 UNIT) tablet Take 1,000 Units by mouth daily.     zolpidem  (AMBIEN  CR) 6.25 MG CR tablet Take 1 tablet (6.25 mg total) by mouth at bedtime as needed for sleep. 30 tablet 2   No current facility-administered medications on file prior to visit.    Review of Systems  Constitutional:  Negative for chills and fever.  Respiratory:  Negative for cough.   Cardiovascular:  Negative for chest pain and palpitations.  Gastrointestinal:  Negative for nausea and vomiting.      Objective:    BP 128/78   Pulse 78   Temp 98.4 F (36.9 C) (Oral)   Ht 5' 4 (1.626 m)   Wt 193 lb 6.4 oz (87.7 kg)   LMP  (LMP Unknown)   SpO2 98%   BMI 33.20 kg/m  BP Readings from Last 3 Encounters:  07/24/23 128/78  04/23/23 130/78  04/09/23 126/82   Wt  Readings from Last 3 Encounters:  07/24/23 193 lb 6.4 oz (87.7 kg)  04/23/23 218 lb (98.9 kg)  04/09/23 220 lb 3.2 oz (99.9 kg)    Physical Exam Vitals reviewed.  Constitutional:      Appearance: She is well-developed.  Eyes:     Conjunctiva/sclera: Conjunctivae normal.  Neck:     Thyroid : Thyromegaly present. No thyroid  mass or thyroid  tenderness.  Cardiovascular:     Rate and Rhythm: Normal rate and regular rhythm.     Pulses: Normal pulses.     Heart sounds: Normal heart sounds.  Pulmonary:     Effort: Pulmonary effort is normal.     Breath sounds: Normal breath sounds. No wheezing, rhonchi or rales.  Skin:    General: Skin is  warm and dry.     Comments: Velvety hyperpigmentation along nape of neck. 1 cm brown macule noted thoracic back ( see picture).  Nontender  Neurological:     Mental Status: She is alert.  Psychiatric:        Speech: Speech normal.        Behavior: Behavior normal.        Thought Content: Thought content normal.

## 2023-08-19 ENCOUNTER — Ambulatory Visit
Admission: RE | Admit: 2023-08-19 | Discharge: 2023-08-19 | Disposition: A | Discharge status: Home / Self Care | Source: Ambulatory Visit | Attending: Family | Admitting: Family

## 2023-08-19 DIAGNOSIS — E049 Nontoxic goiter, unspecified: Secondary | ICD-10-CM | POA: Diagnosis present

## 2023-08-24 ENCOUNTER — Ambulatory Visit
Admission: RE | Admit: 2023-08-24 | Discharge: 2023-08-24 | Disposition: A | Discharge status: Home / Self Care | Source: Ambulatory Visit | Attending: Family | Admitting: Family

## 2023-08-24 ENCOUNTER — Encounter: Payer: Self-pay | Admitting: Family

## 2023-08-24 DIAGNOSIS — Z1231 Encounter for screening mammogram for malignant neoplasm of breast: Secondary | ICD-10-CM | POA: Insufficient documentation

## 2023-08-24 DIAGNOSIS — Z0000 Encounter for general adult medical examination without abnormal findings: Secondary | ICD-10-CM

## 2023-09-23 ENCOUNTER — Encounter: Payer: Self-pay | Admitting: *Deleted

## 2023-09-23 ENCOUNTER — Other Ambulatory Visit: Payer: Self-pay

## 2023-09-23 ENCOUNTER — Emergency Department

## 2023-09-23 DIAGNOSIS — R0789 Other chest pain: Secondary | ICD-10-CM | POA: Insufficient documentation

## 2023-09-23 DIAGNOSIS — R0602 Shortness of breath: Secondary | ICD-10-CM | POA: Diagnosis not present

## 2023-09-23 DIAGNOSIS — E119 Type 2 diabetes mellitus without complications: Secondary | ICD-10-CM | POA: Diagnosis not present

## 2023-09-23 DIAGNOSIS — R079 Chest pain, unspecified: Secondary | ICD-10-CM | POA: Diagnosis present

## 2023-09-23 LAB — BASIC METABOLIC PANEL
Anion gap: 9 (ref 5–15)
BUN: <5 mg/dL — ABNORMAL LOW (ref 6–20)
CO2: 25 mmol/L (ref 22–32)
Calcium: 9.3 mg/dL (ref 8.9–10.3)
Chloride: 103 mmol/L (ref 98–111)
Creatinine, Ser: 0.75 mg/dL (ref 0.44–1.00)
GFR, Estimated: >60 mL/min (ref >60)
Glucose, Bld: 104 mg/dL — ABNORMAL HIGH (ref 70–99)
Potassium: 3.6 mmol/L (ref 3.5–5.1)
Sodium: 137 mmol/L (ref 135–145)

## 2023-09-23 LAB — CBC
HCT: 37 % (ref 36.0–46.0)
Hemoglobin: 12.1 g/dL (ref 12.0–15.0)
MCH: 27.8 pg (ref 26.0–34.0)
MCHC: 32.7 g/dL (ref 30.0–36.0)
MCV: 85.1 fL (ref 80.0–100.0)
Platelets: 365 10*3/uL (ref 150–400)
RBC: 4.35 MIL/uL (ref 3.87–5.11)
RDW: 14 % (ref 11.5–15.5)
WBC: 10.9 10*3/uL — ABNORMAL HIGH (ref 4.0–10.5)
nRBC: 0 % (ref 0.0–0.2)

## 2023-09-23 LAB — TROPONIN I (HIGH SENSITIVITY): Troponin I (High Sensitivity): 4 ng/L (ref <18)

## 2023-09-23 NOTE — ED Triage Notes (Signed)
 Pt reports chest pain and sob for 2 days.  Pt states she thought it was indigestion.  No n/v   nonsmoker.  No cough   pt alert  speech clear.

## 2023-09-24 ENCOUNTER — Emergency Department

## 2023-09-24 ENCOUNTER — Emergency Department
Admission: EM | Admit: 2023-09-24 | Discharge: 2023-09-24 | Disposition: A | Discharge status: Home / Self Care | Attending: Emergency Medicine | Admitting: Emergency Medicine

## 2023-09-24 DIAGNOSIS — R079 Chest pain, unspecified: Secondary | ICD-10-CM

## 2023-09-24 DIAGNOSIS — R0602 Shortness of breath: Secondary | ICD-10-CM

## 2023-09-24 LAB — HEPATIC FUNCTION PANEL
ALT: 20 U/L (ref 0–44)
AST: 20 U/L (ref 15–41)
Albumin: 3.8 g/dL (ref 3.5–5.0)
Alkaline Phosphatase: 72 U/L (ref 38–126)
Bilirubin, Direct: <0.1 mg/dL (ref 0.0–0.2)
Total Bilirubin: 0.4 mg/dL (ref 0.0–1.2)
Total Protein: 7.8 g/dL (ref 6.5–8.1)

## 2023-09-24 LAB — TROPONIN I (HIGH SENSITIVITY): Troponin I (High Sensitivity): 4 ng/L (ref <18)

## 2023-09-24 LAB — LIPASE, BLOOD: Lipase: 27 U/L (ref 11–51)

## 2023-09-24 LAB — D-DIMER, QUANTITATIVE: D-Dimer, Quant: 0.98 ug{FEU}/mL — ABNORMAL HIGH (ref 0.00–0.50)

## 2023-09-24 MED ORDER — IOHEXOL 350 MG/ML SOLN
75.0000 mL | Freq: Once | INTRAVENOUS | Status: AC | PRN
  Administered 2023-09-24: 75 mL via INTRAVENOUS

## 2023-09-24 NOTE — ED Provider Notes (Signed)
 [facility] Provider Note    None    (approximate)   History   Chest Pain   HPI  Ann Stevens is a 48 y.o. female   Past medical history of otherwise healthy young woman who presents to the emergency department with shortness of breath and chest tightness over the last 2 days.  Worse with exertion.  Nonradiating.  No respiratory infectious symptoms.  No history of blood clot, leg symptoms immobilization but she does take oral contraceptive  At rest minimal symptoms and is completely asymptomatic at this time.   External Medical Documents Reviewed: Family practice outpatient visit from January 2025 documenting past medical history of diabetes on Ozempic, menorrhagia with irregular cycle on oral contraceptive      Physical Exam   Triage Vital Signs: ED Triage Vitals  Encounter Vitals Group     BP 09/23/23 2251 (!) 145/79     Systolic BP Percentile --      Diastolic BP Percentile --      Pulse Rate 09/23/23 2251 74     Resp 09/23/23 2251 18     Temp 09/23/23 2251 98.5 F (36.9 C)     Temp Source 09/23/23 2251 Oral     SpO2 09/23/23 2251 98 %     Weight 09/23/23 2245 189 lb (85.7 kg)     Height 09/23/23 2245 5\' 4"  (1.626 m)     Head Circumference --      Peak Flow --      Pain Score 09/23/23 2245 0     Pain Loc --      Pain Education --      Exclude from Growth Chart --     Most recent vital signs: Vitals:   09/23/23 2251  BP: (!) 145/79  Pulse: 74  Resp: 18  Temp: 98.5 F (36.9 C)  SpO2: 98%    General: Awake, no distress.  CV:  Good peripheral perfusion.  Resp:  Normal effort.  Abd:  No distention.  Other:  Awake alert comfortable appearing slightly hypertensive otherwise vital signs normal.  Breathing comfortably on room air no hypoxemia.  Clear lungs without focality or wheezing.  No obvious heart murmurs.  Appears euvolemic overall.   ED Results / Procedures / Treatments   Labs (all labs ordered are listed, but only  abnormal results are displayed) Labs Reviewed  BASIC METABOLIC PANEL - Abnormal; Notable for the following components:      Result Value   Glucose, Bld 104 (*)    BUN <5 (*)    All other components within normal limits  CBC - Abnormal; Notable for the following components:   WBC 10.9 (*)    All other components within normal limits  HEPATIC FUNCTION PANEL  LIPASE, BLOOD  D-DIMER, QUANTITATIVE  TROPONIN I (HIGH SENSITIVITY)  TROPONIN I (HIGH SENSITIVITY)     I ordered and reviewed the above labs they are notable for initial troponin negative.  EKG  ED ECG REPORT I, Pilar Jarvis, the attending physician, personally viewed and interpreted this ECG.   Date: 09/24/2023  EKG Time: 2252  Rate: 74  Rhythm: sinus  Axis: nl  Intervals:none  ST&T Change: no stemi    RADIOLOGY I independently reviewed and interpreted chest x-ray and I see no obvious focality pneumothorax I also reviewed radiologist's formal read.   PROCEDURES:  Critical Care performed: No  Procedures   MEDICATIONS ORDERED IN ED: Medications - No data to display   IMPRESSION /  MDM / ASSESSMENT AND PLAN / ED COURSE  I reviewed the triage vital signs and the nursing notes.                                Patient's presentation is most consistent with acute presentation with potential threat to life or bodily function.  Differential diagnosis includes, but is not limited to, ACS, PE, dissection, respiratory infection, anemia   The patient is on the cardiac monitor to evaluate for evidence of arrhythmia and/or significant heart rate changes.  MDM:    Chest tightness and shortness of breath over the last 2 days concerning for ACS or PE.  Fortunately EKG is nonischemic and initial troponin is negative, given the chronic 2-day nature of this chest pain I doubt cardiac ischemia/ACS in the setting of normal troponin, low cardiac risk factors.  Consider PE given her shortness of breath as well as her chest  tightness and oral contraceptive use, check D-dimer.  Does not seem consistent with aortic dissection, respiratory infection.  Given her hemodynamic stability and asymptomatic at this time, I think that if her VTE workup is negative then the clinical suspicion for life-threatening cardiopulmonary emergency at this time would be very low and she can be discharged with close PMD follow-up.         FINAL CLINICAL IMPRESSION(S) / ED DIAGNOSES   Final diagnoses:  Nonspecific chest pain  Shortness of breath     Rx / DC Orders   ED Discharge Orders     None        Note:  This document was prepared using Dragon voice recognition software and may include unintentional dictation errors.    Pilar Jarvis, MD 09/24/23 [phone]

## 2023-09-24 NOTE — Discharge Instructions (Signed)
 Fortunately your evaluation the emergency department did not show any emergency life threatening conditions account for your symptoms at this time.  Your testing for heart attack and blood clot in the lungs was negative.  Thank you for choosing [place] for your health care today!  Please see your primary doctor this week for a follow up appointment.   If you have any new, worsening, or unexpected symptoms call your doctor right away or come back to the emergency department for reevaluation.  It was my pleasure to care for you today.   Daneil Dan Modesto Charon, MD

## 2023-11-23 ENCOUNTER — Ambulatory Visit: Admitting: Family

## 2023-11-23 ENCOUNTER — Encounter: Payer: Self-pay | Admitting: Family

## 2023-11-23 VITALS — BP 124/72 | HR 75 | Temp 98.5°F | Ht 64.0 in | Wt 185.2 lb

## 2023-11-23 DIAGNOSIS — G4700 Insomnia, unspecified: Secondary | ICD-10-CM | POA: Diagnosis not present

## 2023-11-23 DIAGNOSIS — Z7985 Long-term (current) use of injectable non-insulin antidiabetic drugs: Secondary | ICD-10-CM | POA: Diagnosis not present

## 2023-11-23 DIAGNOSIS — E119 Type 2 diabetes mellitus without complications: Secondary | ICD-10-CM | POA: Diagnosis not present

## 2023-11-23 MED ORDER — SEMAGLUTIDE (1 MG/DOSE) 4 MG/3ML ~~LOC~~ SOPN: 1.0000 mg | PEN_INJECTOR | SUBCUTANEOUS | 3 refills | Status: DC

## 2023-11-23 NOTE — Assessment & Plan Note (Signed)
 Excellent control.  For additional benefit of weight loss, patient would like to increase Ozempic  to 1 mg.  Counseled on constipation.  Advised MiraLAX and to let me know if constipation were to develop on increased dose.  This to be a reason for decreasing ozempic , or discontinuing

## 2023-11-23 NOTE — Assessment & Plan Note (Signed)
 Chronic, stable.  Infrequent use of Ambien .  Counseled on over-the-counter melatonin combination agents in which she can trial

## 2023-11-23 NOTE — Progress Notes (Signed)
 Assessment & Plan:  Diabetes mellitus without complication (HCC) Assessment & Plan: Excellent control.  For additional benefit of weight loss, patient would like to increase Ozempic  to 1 mg.  Counseled on constipation.  Advised MiraLAX and to let me know if constipation were to develop on increased dose.  This to be a reason for decreasing ozempic , or discontinuing  Orders: -     Semaglutide  (1 MG/DOSE); Inject 1 mg as directed once a week.  Dispense: 9 mL; Refill: 3  Insomnia, unspecified type Assessment & Plan: Chronic, stable.  Infrequent use of Ambien .  Counseled on over-the-counter melatonin combination agents in which she can trial      Return precautions given.   Risks, benefits, and alternatives of the medications and treatment plan prescribed today were discussed, and patient expressed understanding.   Education regarding symptom management and diagnosis given to patient on AVS either electronically or printed.  Return in about 6 months (around 05/25/2024).  Bascom Bossier, FNP  Subjective:    Patient ID: Ann Stevens, female    DOB: [birth year], 48 y.o.   MRN: [id number]  CC: Ann Stevens is a 48 y.o. female who presents today for follow up.   HPI: Feels well today.  No new complaints.  Interested in increasing Ozempic  to aid in weight loss.  No significant constipation.  Reports is very occasional  She does not use Ambien  regularly.  She has nights in which trouble falling asleep  No recurrence of CP.  Denies SOB She is exercising regularly.  Mammogram up-to-date  Allergies: Metformin  and related Current Outpatient Medications on File Prior to Visit  Medication Sig Dispense Refill   cholecalciferol (VITAMIN D3) 25 MCG (1000 UNIT) tablet Take 1,000 Units by mouth daily.     norgestimate -ethinyl estradiol  (ESTARYLLA) 0.25-35 MG-MCG tablet Take 1 tablet by mouth daily. 84 tablet 3   zolpidem  (AMBIEN  CR) 6.25 MG CR tablet Take 1 tablet (6.25 mg total) by  mouth at bedtime as needed for sleep. 30 tablet 2   No current facility-administered medications on file prior to visit.    Review of Systems  Constitutional:  Negative for chills and fever.  Respiratory:  Negative for cough.   Cardiovascular:  Negative for chest pain and palpitations.  Gastrointestinal:  Negative for abdominal pain, constipation, nausea and vomiting.  Psychiatric/Behavioral:  Positive for sleep disturbance.       Objective:    BP 124/72   Pulse 75   Temp 98.5 F (36.9 C) (Oral)   Ht 5\' 4"  (1.626 m)   Wt 185 lb 3.2 oz (84 kg)   LMP  (LMP Unknown)   SpO2 99%   BMI 31.79 kg/m  BP Readings from Last 3 Encounters:  11/23/23 124/72  09/24/23 133/84  07/24/23 128/78   Wt Readings from Last 3 Encounters:  11/23/23 185 lb 3.2 oz (84 kg)  09/23/23 189 lb (85.7 kg)  07/24/23 193 lb 6.4 oz (87.7 kg)    Physical Exam Vitals reviewed.  Constitutional:      Appearance: She is well-developed.  Eyes:     Conjunctiva/sclera: Conjunctivae normal.  Cardiovascular:     Rate and Rhythm: Normal rate and regular rhythm.     Pulses: Normal pulses.     Heart sounds: Normal heart sounds.  Pulmonary:     Effort: Pulmonary effort is normal.     Breath sounds: Normal breath sounds. No wheezing, rhonchi or rales.  Skin:    General: Skin is warm and dry.  Neurological:  Mental Status: She is alert.  Psychiatric:        Speech: Speech normal.        Behavior: Behavior normal.        Thought Content: Thought content normal.

## 2023-11-23 NOTE — Patient Instructions (Addendum)
 Increase Ozempic  to 1 mg.  Monitor constipation.  Start MiraLAX every other day to every third day    essentials for good sleep:   #1 Exercise #2 Limit Caffeine ( no caffeine after lunch) #3 No smart phones, TV prior to bed -- BLUE light is VERY activating and send the brain an 'awake message.'  #4 Go to bed at same time of night each night and get up at same time of day.  #5 Take 0.5 to 5mg  melatonin at 7pm with dinner -this is when natural melatonin will start to increase  You may also trial melatonin combination over the counter supplement such as Sleep#3 ( L- theanine, camomile, lavender, valerian root, and melatonin 10mg )   or Qunol Sleep 5 in 1 (melatonin 5mg , Ashwagandha, GABA, valerian root, L-theanine)   #6 May try over the counter Unisom for sleep aid

## 2024-05-25 ENCOUNTER — Encounter: Payer: Self-pay | Admitting: Family

## 2024-05-25 ENCOUNTER — Ambulatory Visit: Admitting: Family

## 2024-05-25 VITALS — BP 136/88 | HR 67 | Temp 98.0°F | Ht 64.0 in | Wt 176.4 lb

## 2024-05-25 DIAGNOSIS — Z7985 Long-term (current) use of injectable non-insulin antidiabetic drugs: Secondary | ICD-10-CM | POA: Diagnosis not present

## 2024-05-25 DIAGNOSIS — R0683 Snoring: Secondary | ICD-10-CM

## 2024-05-25 DIAGNOSIS — E66811 Obesity, class 1: Secondary | ICD-10-CM | POA: Diagnosis not present

## 2024-05-25 DIAGNOSIS — R7303 Prediabetes: Secondary | ICD-10-CM

## 2024-05-25 DIAGNOSIS — E119 Type 2 diabetes mellitus without complications: Secondary | ICD-10-CM

## 2024-05-25 LAB — CBC WITH DIFFERENTIAL/PLATELET
Basophils Absolute: 0 K/uL (ref 0.0–0.1)
Basophils Relative: 0.7 % (ref 0.0–3.0)
Eosinophils Absolute: 0.2 K/uL (ref 0.0–0.7)
Eosinophils Relative: 3.6 % (ref 0.0–5.0)
HCT: 38 % (ref 36.0–46.0)
Hemoglobin: 12.4 g/dL (ref 12.0–15.0)
Lymphocytes Relative: 31.1 % (ref 12.0–46.0)
Lymphs Abs: 2.1 K/uL (ref 0.7–4.0)
MCHC: 32.6 g/dL (ref 30.0–36.0)
MCV: 83.5 fl (ref 78.0–100.0)
Monocytes Absolute: 0.4 K/uL (ref 0.1–1.0)
Monocytes Relative: 5.2 % (ref 3.0–12.0)
Neutro Abs: 4.1 K/uL (ref 1.4–7.7)
Neutrophils Relative %: 59.4 % (ref 43.0–77.0)
Platelets: 331 K/uL (ref 150.0–400.0)
RBC: 4.55 Mil/uL (ref 3.87–5.11)
RDW: 15.1 % (ref 11.5–15.5)
WBC: 6.8 K/uL (ref 4.0–10.5)

## 2024-05-25 LAB — LIPID PANEL
Cholesterol: 256 mg/dL — ABNORMAL HIGH (ref 0–200)
HDL: 63.9 mg/dL (ref >39.00)
LDL Cholesterol: 170 mg/dL — ABNORMAL HIGH (ref 0–99)
NonHDL: 192.4
Total CHOL/HDL Ratio: 4
Triglycerides: 111 mg/dL (ref 0.0–149.0)
VLDL: 22.2 mg/dL (ref 0.0–40.0)

## 2024-05-25 LAB — COMPREHENSIVE METABOLIC PANEL WITH GFR
ALT: 10 U/L (ref 0–35)
AST: 14 U/L (ref 0–37)
Albumin: 4.2 g/dL (ref 3.5–5.2)
Alkaline Phosphatase: 73 U/L (ref 39–117)
BUN: 6 mg/dL (ref 6–23)
CO2: 28 meq/L (ref 19–32)
Calcium: 9.4 mg/dL (ref 8.4–10.5)
Chloride: 102 meq/L (ref 96–112)
Creatinine, Ser: 0.81 mg/dL (ref 0.40–1.20)
GFR: 85.89 mL/min (ref >60.00)
Glucose, Bld: 86 mg/dL (ref 70–99)
Potassium: 4.2 meq/L (ref 3.5–5.1)
Sodium: 138 meq/L (ref 135–145)
Total Bilirubin: 0.3 mg/dL (ref 0.2–1.2)
Total Protein: 7.7 g/dL (ref 6.0–8.3)

## 2024-05-25 LAB — POCT GLYCOSYLATED HEMOGLOBIN (HGB A1C): HbA1c POC (<> result, manual entry): 5.6 % (ref 4.0–5.6)

## 2024-05-25 LAB — MICROALBUMIN / CREATININE URINE RATIO
Creatinine,U: 194.5 mg/dL
Microalb Creat Ratio: 12.7 mg/g (ref 0.0–30.0)
Microalb, Ur: 2.5 mg/dL — ABNORMAL HIGH (ref 0.0–1.9)

## 2024-05-25 NOTE — Assessment & Plan Note (Signed)
 Chronic, excellent glycemic control.  Discussed ASCVD risk in setting of DM and importance of LDL being less than 70.  Pending lipid panel today.  Tolerating semaglutide  1 mg overall well.  Some constipation.  Patient feels this is mild and not particular bothersome.  Counseled on how to schedule MiraLAX once to twice weekly.  She will let me know if symptom is not well-controlled.

## 2024-05-25 NOTE — Assessment & Plan Note (Signed)
 Patient has mild symptoms of OSA.  Recommended consult with [facility] for further evaluation of sinus pathology and OSA.  Discussed weight loss as being appropriate therapy if she does not fact have sleep apnea.  Continue semaglutide  1 mg.

## 2024-05-25 NOTE — Progress Notes (Signed)
 Assessment & Plan:  Diabetes mellitus without complication (HCC) Assessment & Plan: Chronic, excellent glycemic control.  Discussed ASCVD risk in setting of DM and importance of LDL being less than 70.  Pending lipid panel today.  Tolerating semaglutide  1 mg overall well.  Some constipation.  Patient feels this is mild and not particular bothersome.  Counseled on how to schedule MiraLAX once to twice weekly.  She will let me know if symptom is not well-controlled.  Orders: -     POCT glycosylated hemoglobin (Hb A1C) -     Comprehensive metabolic panel with GFR -     Lipid panel -     Microalbumin / creatinine urine ratio -     CBC with Differential/Platelet  Snoring Assessment & Plan: Patient has mild symptoms of OSA.  Recommended consult with [facility] for further evaluation of sinus pathology and OSA.  Discussed weight loss as being appropriate therapy if she does not fact have sleep apnea.  Continue semaglutide  1 mg.  Orders: -     Ambulatory referral to ENT  Obesity (BMI 30.0-34.9) -     TSH     Return precautions given.   Risks, benefits, and alternatives of the medications and treatment plan prescribed today were discussed, and patient expressed understanding.   Education regarding symptom management and diagnosis given to patient on AVS either electronically or printed.  Return in about 6 months (around 11/22/2024).  Ann Northern, FNP  Subjective:    Patient ID: Ann Stevens, female    DOB: 1976-02-23, 48 y.o.   MRN: [id number]  CC: Ann Stevens is a 48 y.o. female who presents today for follow up.   HPI: She feels well today She has occasional small formed BM which is relieved with miralax prn. No blood in stool .    Her husband states that she snores.  Occasional HA, sleep is not always restorative.   Denies facial congestion.  Smart watch mentioned risk of sleep apnea.     Allergies: Metformin  and related Current Outpatient Medications on File  Prior to Visit  Medication Sig Dispense Refill   cholecalciferol (VITAMIN D3) 25 MCG (1000 UNIT) tablet Take 1,000 Units by mouth daily.     norgestimate -ethinyl estradiol  (ESTARYLLA) 0.25-35 MG-MCG tablet Take 1 tablet by mouth daily. 84 tablet 3   Semaglutide , 1 MG/DOSE, 4 MG/3ML SOPN Inject 1 mg as directed once a week. 9 mL 3   No current facility-administered medications on file prior to visit.    Review of Systems  Constitutional:  Negative for chills and fever.  HENT:  Negative for congestion.   Respiratory:  Negative for cough.   Cardiovascular:  Negative for chest pain and palpitations.  Gastrointestinal:  Negative for nausea and vomiting.      Objective:    BP 136/88   Pulse 67   Temp 98 F (36.7 C) (Oral)   Ht 5' 4 (1.626 m)   Wt 176 lb 6.4 oz (80 kg)   SpO2 98%   BMI 30.28 kg/m  BP Readings from Last 3 Encounters:  05/25/24 136/88  11/23/23 124/72  09/24/23 133/84   Wt Readings from Last 3 Encounters:  05/25/24 176 lb 6.4 oz (80 kg)  11/23/23 185 lb 3.2 oz (84 kg)  09/23/23 189 lb (85.7 kg)    Physical Exam Vitals reviewed.  Constitutional:      Appearance: She is well-developed.  HENT:     Nose:     Right Turbinates: Not enlarged, swollen  or pale.     Left Turbinates: Not enlarged, swollen or pale.  Eyes:     Conjunctiva/sclera: Conjunctivae normal.  Cardiovascular:     Rate and Rhythm: Normal rate and regular rhythm.     Pulses: Normal pulses.     Heart sounds: Normal heart sounds.  Pulmonary:     Effort: Pulmonary effort is normal.     Breath sounds: Normal breath sounds. No wheezing, rhonchi or rales.  Skin:    General: Skin is warm and dry.  Neurological:     Mental Status: She is alert.  Psychiatric:        Speech: Speech normal.        Behavior: Behavior normal.        Thought Content: Thought content normal.

## 2024-05-25 NOTE — Patient Instructions (Signed)
 Referral to [facility] Let us  know if you dont hear back within 2 weeks in regards to an appointment being scheduled.   So that you are aware, if you are [facility] MyChart user , please pay attention to your MyChart messages as you may receive a MyChart message with a phone number to call and schedule this test/appointment own your own from our referral coordinator. This is a new process so I do not want you to miss this message.  If you are not a MyChart user, you will receive a phone call.    Please schedule MiraLAX once twice weekly to better manage your bowels.  Please let me know if any issues in regards to constipation as this is a common side effect of semaglutide 

## 2024-05-26 ENCOUNTER — Ambulatory Visit: Payer: Self-pay | Admitting: Family

## 2024-05-26 DIAGNOSIS — E785 Hyperlipidemia, unspecified: Secondary | ICD-10-CM

## 2024-05-26 LAB — TSH: TSH: 1.39 u[IU]/mL (ref 0.35–5.50)

## 2024-05-27 MED ORDER — ROSUVASTATIN CALCIUM 10 MG PO TABS: 10.0000 mg | ORAL_TABLET | Freq: Every day | ORAL | 3 refills | Status: AC

## 2024-06-30 ENCOUNTER — Ambulatory Visit: Admitting: Family

## 2024-06-30 ENCOUNTER — Encounter: Payer: Self-pay | Admitting: Family

## 2024-06-30 VITALS — BP 130/82 | HR 77 | Temp 98.4°F | Ht 64.0 in | Wt 176.0 lb

## 2024-06-30 DIAGNOSIS — N3001 Acute cystitis with hematuria: Secondary | ICD-10-CM

## 2024-06-30 DIAGNOSIS — E119 Type 2 diabetes mellitus without complications: Secondary | ICD-10-CM

## 2024-06-30 DIAGNOSIS — N390 Urinary tract infection, site not specified: Secondary | ICD-10-CM | POA: Insufficient documentation

## 2024-06-30 LAB — POCT URINALYSIS DIPSTICK
Bilirubin, UA: NEGATIVE
Glucose, UA: NEGATIVE
Ketones, UA: NEGATIVE
Nitrite, UA: NEGATIVE
Protein, UA: POSITIVE — AB
Spec Grav, UA: 1.015 (ref 1.010–1.025)
Urobilinogen, UA: 1 U/dL
pH, UA: 7 (ref 5.0–8.0)

## 2024-06-30 MED ORDER — LANCETS MISC: 1.0000 | 0 refills | Status: DC

## 2024-06-30 MED ORDER — LANCET DEVICE MISC: 1.0000 | 0 refills | Status: AC

## 2024-06-30 MED ORDER — NITROFURANTOIN MONOHYD MACRO 100 MG PO CAPS: 100.0000 mg | ORAL_CAPSULE | Freq: Two times a day (BID) | ORAL | 0 refills | Status: AC

## 2024-06-30 MED ORDER — BLOOD GLUCOSE TEST VI STRP: 1.0000 | ORAL_STRIP | 0 refills | Status: DC

## 2024-06-30 MED ORDER — BLOOD GLUCOSE MONITORING SUPPL DEVI: 1.0000 | 0 refills | Status: AC

## 2024-06-30 NOTE — Assessment & Plan Note (Signed)
 Well-controlled on semaglutide  1 mg   no symptoms to suggest hyperglycemia at this time.  Counseled patient on how to use a glucometer to check blood sugar especially when she is sick.

## 2024-06-30 NOTE — Patient Instructions (Addendum)
 Goal of fasting blood sugar is between 70-120. If in this range, we are reaching our target a1c ( goal 6.5%)   If your blood sugar is less than 180 TWO hours after your largest meal ( post prandial), again we are reaching our target a1c goal   Call [facility] clinic if: BG < 70 or > 300.   If you have any symptoms of low blood sugar ( sweating, shakiness, lightheaded, dizzy) that you notify me. If you have a low, please drink a glass of orange juice and recheck blood sugar every 5 minutes until you dont feel symptomatic AND blood sugar is above 80.   Start macrobid if symptoms persist while we await culture  Ensure to take probiotics while on antibiotics and also for 2 weeks after completion. This can either be by eating yogurt daily or taking a probiotic supplement over the counter such as Culturelle.It is important to re-colonize the gut with good bacteria and also to prevent any diarrheal infections associated with antibiotic use.

## 2024-06-30 NOTE — Assessment & Plan Note (Signed)
 No systemic features. POC urine shows moderate blood, moderate leukocytes.  Negative nitrites.  She completed her menses this week. Based on point-of-care urine, we opted to go ahead and send over Macrobid patient to start if symptoms persist following await urine culture.  She understands to start probiotics.  Repeat urinalysis in 2 weeks time to ensure her RBCs have resolved.

## 2024-06-30 NOTE — Progress Notes (Unsigned)
 Assessment & Plan:  Acute cystitis with hematuria Assessment & Plan: No systemic features. POC urine shows moderate blood, moderate leukocytes.  Negative nitrites.  She completed her menses this week. Based on point-of-care urine, we opted to go ahead and send over Macrobid patient to start if symptoms persist following await urine culture.  She understands to start probiotics.  Repeat urinalysis in 2 weeks time to ensure her RBCs have resolved.  Orders: -     Urinalysis, Complete -     Urine Culture -     POCT urinalysis dipstick -     Urinalysis, Routine w reflex microscopic; Future  Diabetes mellitus without complication (HCC) Assessment & Plan: Well-controlled on semaglutide  1 mg   no symptoms to suggest hyperglycemia at this time.  Counseled patient on how to use a glucometer to check blood sugar especially when she is sick.  Orders: -     Blood Glucose Monitoring Suppl; 1 each by Does not apply route as directed. Dispense based on patient and insurance preference. Use up to four times daily as directed. (FOR ICD-10 E10.9, E11.9).  Dispense: 1 each; Refill: 0 -     Blood Glucose Test; 1 each by Does not apply route as directed. Dispense based on patient and insurance preference. Use up to four times daily as directed. (FOR ICD-10 E10.9, E11.9).  Dispense: 100 strip; Refill: 0 -     Lancet Device; 1 each by Does not apply route as directed. Dispense based on patient and insurance preference. Use up to four times daily as directed. (FOR ICD-10 E10.9, E11.9).  Dispense: 1 each; Refill: 0 -     Lancets; 1 each by Does not apply route as directed. Dispense based on patient and insurance preference. Use up to four times daily as directed. (FOR ICD-10 E10.9, E11.9).  Dispense: 100 each; Refill: 0  Other orders -     Nitrofurantoin Monohyd Macro; Take 1 capsule (100 mg total) by mouth 2 (two) times daily. Take with food.  Dispense: 10 capsule; Refill: 0     Return precautions given.    Risks, benefits, and alternatives of the medications and treatment plan prescribed today were discussed, and patient expressed understanding.   Education regarding symptom management and diagnosis given to patient on AVS either electronically or printed.  No follow-ups on file.  Rollene Northern, FNP  Subjective:    Patient ID: Ann Stevens, female    DOB: Aug 10, 1975, 48 y.o.   MRN: [id number]  CC: Ann Stevens is a 48 y.o. female who presents today for an acute visit.    HPI: HPI Discussed the use of AI scribe software for clinical note transcription with the patient, who gave verbal consent to proceed.  History of Present Illness   Ann Stevens is a 48 year old female who presents with urinary symptoms.  She began experiencing urinary symptoms this morning, waking up at 4 AM with an urge to urinate. Despite frequent urges, only small amounts of urine were passed, accompanied by a 'weird feeling' of discomfort after urination, which was neither painful nor burning. This sensation persisted, keeping her awake until around 6:30 or 7 AM.  She recalls a similar episode in 2021, which included an odor and discomfort after urination. Denies unusual vaginal discharge, fever, chills, flank pain, vomiting, or constipation. She recently completed her menstrual cycle and used a tampon  yesterday due to light flow.  She reports no symptoms of hyperglycemia such as increased thirst or frequent urination. She  does not have a glucometer at home.       No history of CKD No recent UTI  Allergies: Metformin  and related Medications Ordered Prior to Encounter[1]  Review of Systems  Constitutional:  Negative for chills and fever.  Respiratory:  Negative for cough.   Cardiovascular:  Negative for chest pain and palpitations.  Gastrointestinal:  Negative for abdominal pain, nausea and vomiting.  Endocrine: Negative for polydipsia and polyuria.  Genitourinary:  Positive for dysuria.  Negative for flank pain and frequency.      Objective:    BP 130/82   Pulse 77   Temp 98.4 F (36.9 C)   Ht 5' 4 (1.626 m)   Wt 176 lb (79.8 kg)   SpO2 95%   BMI 30.21 kg/m   BP Readings from Last 3 Encounters:  06/30/24 130/82  05/25/24 136/88  11/23/23 124/72   Wt Readings from Last 3 Encounters:  06/30/24 176 lb (79.8 kg)  05/25/24 176 lb 6.4 oz (80 kg)  11/23/23 185 lb 3.2 oz (84 kg)    Physical Exam Vitals reviewed.  Constitutional:      Appearance: Normal appearance. She is well-developed.  Eyes:     Conjunctiva/sclera: Conjunctivae normal.  Cardiovascular:     Rate and Rhythm: Normal rate and regular rhythm.     Pulses: Normal pulses.     Heart sounds: Normal heart sounds.  Pulmonary:     Effort: Pulmonary effort is normal.     Breath sounds: Normal breath sounds. No wheezing, rhonchi or rales.  Abdominal:     General: Bowel sounds are normal. There is no distension.     Palpations: Abdomen is soft. Abdomen is not rigid. There is no fluid wave or mass.     Tenderness: There is no abdominal tenderness. There is no right CVA tenderness, left CVA tenderness, guarding or rebound.  Skin:    General: Skin is warm and dry.  Neurological:     Mental Status: She is alert.  Psychiatric:        Speech: Speech normal.        Behavior: Behavior normal.        Thought Content: Thought content normal.          [1]  Current Outpatient Medications on File Prior to Visit  Medication Sig Dispense Refill   cholecalciferol (VITAMIN D3) 25 MCG (1000 UNIT) tablet Take 1,000 Units by mouth daily.     fluticasone (FLONASE) 50 MCG/ACT nasal spray Place 2 sprays into both nostrils daily.     norgestimate -ethinyl estradiol  (ESTARYLLA) 0.25-35 MG-MCG tablet Take 1 tablet by mouth daily. 84 tablet 3   rosuvastatin  (CRESTOR ) 10 MG tablet Take 1 tablet (10 mg total) by mouth daily. 90 tablet 3   Semaglutide , 1 MG/DOSE, 4 MG/3ML SOPN Inject 1 mg as directed once a week. 9 mL 3    No current facility-administered medications on file prior to visit.

## 2024-07-02 ENCOUNTER — Ambulatory Visit: Payer: Self-pay | Admitting: Family

## 2024-07-02 DIAGNOSIS — R899 Unspecified abnormal finding in specimens from other organs, systems and tissues: Secondary | ICD-10-CM

## 2024-07-02 LAB — URINALYSIS, COMPLETE
Bilirubin Urine: NEGATIVE
Glucose, UA: NEGATIVE
Ketones, ur: NEGATIVE
Nitrite: NEGATIVE
Specific Gravity, Urine: 1.013 (ref 1.001–1.035)
WBC, UA: >=60 /HPF — AB (ref 0–5)
pH: 6.5 (ref 5.0–8.0)

## 2024-07-02 LAB — URINE CULTURE
MICRO NUMBER:: 17343909
SPECIMEN QUALITY:: ADEQUATE

## 2024-07-08 ENCOUNTER — Other Ambulatory Visit

## 2024-07-08 DIAGNOSIS — E785 Hyperlipidemia, unspecified: Secondary | ICD-10-CM

## 2024-07-08 DIAGNOSIS — R899 Unspecified abnormal finding in specimens from other organs, systems and tissues: Secondary | ICD-10-CM | POA: Diagnosis not present

## 2024-07-08 LAB — LIPID PANEL
Cholesterol: 134 mg/dL (ref 28–200)
HDL: 50.9 mg/dL
LDL Cholesterol: 56 mg/dL (ref 10–99)
NonHDL: 83.11
Total CHOL/HDL Ratio: 3
Triglycerides: 136 mg/dL (ref 10.0–149.0)
VLDL: 27.2 mg/dL (ref 0.0–40.0)

## 2024-07-08 LAB — URINALYSIS, ROUTINE W REFLEX MICROSCOPIC
Bilirubin Urine: NEGATIVE
Ketones, ur: NEGATIVE
Nitrite: NEGATIVE
Specific Gravity, Urine: 1.01 (ref 1.000–1.030)
Total Protein, Urine: NEGATIVE
Urine Glucose: NEGATIVE
Urobilinogen, UA: 0.2 (ref 0.0–1.0)
pH: 6.5 (ref 5.0–8.0)

## 2024-07-08 LAB — HEPATIC FUNCTION PANEL
ALT: 9 U/L (ref 3–35)
AST: 13 U/L (ref 5–37)
Albumin: 4 g/dL (ref 3.5–5.2)
Alkaline Phosphatase: 69 U/L (ref 39–117)
Bilirubin, Direct: 0.1 mg/dL (ref 0.1–0.3)
Total Bilirubin: 0.3 mg/dL (ref 0.2–1.2)
Total Protein: 7.1 g/dL (ref 6.0–8.3)

## 2024-07-10 ENCOUNTER — Ambulatory Visit: Payer: Self-pay | Admitting: Family

## 2024-07-10 DIAGNOSIS — R899 Unspecified abnormal finding in specimens from other organs, systems and tissues: Secondary | ICD-10-CM

## 2024-07-22 ENCOUNTER — Other Ambulatory Visit: Payer: Self-pay | Admitting: Family

## 2024-07-22 DIAGNOSIS — E119 Type 2 diabetes mellitus without complications: Secondary | ICD-10-CM

## 2024-07-27 ENCOUNTER — Other Ambulatory Visit

## 2024-07-27 DIAGNOSIS — R899 Unspecified abnormal finding in specimens from other organs, systems and tissues: Secondary | ICD-10-CM | POA: Diagnosis not present

## 2024-07-27 LAB — URINALYSIS, ROUTINE W REFLEX MICROSCOPIC
Bilirubin Urine: NEGATIVE
Ketones, ur: NEGATIVE
Nitrite: NEGATIVE
Specific Gravity, Urine: 1.015 (ref 1.000–1.030)
Urine Glucose: NEGATIVE
Urobilinogen, UA: 1 (ref 0.0–1.0)
pH: 6.5 (ref 5.0–8.0)

## 2024-08-01 ENCOUNTER — Ambulatory Visit: Payer: Self-pay | Admitting: Family

## 2024-08-01 ENCOUNTER — Other Ambulatory Visit: Payer: Self-pay | Admitting: Family

## 2024-08-01 ENCOUNTER — Encounter: Payer: Self-pay | Admitting: Family

## 2024-08-01 DIAGNOSIS — R319 Hematuria, unspecified: Secondary | ICD-10-CM

## 2024-08-03 DIAGNOSIS — R3129 Other microscopic hematuria: Secondary | ICD-10-CM | POA: Insufficient documentation

## 2024-08-03 NOTE — Assessment & Plan Note (Addendum)
 Microhematuria with fluctuating LUTS / pyuria  - limited culture data, ambiguous UAs  - likely UTI vs inflammatory cystitis   - low-risk clinical history  - catheterized UA/culture today -prior samples have had high squamous contamination - RBUS for baseline anatomy  - Discussed the role of diagnostic cystoscopy-Will defer for now although certainly consider if equivocal urinalysis, ongoing symptoms -RTC in ~2-3 weeks once RBUS complete

## 2024-08-03 NOTE — Progress Notes (Signed)
 "  08/04/24 2:04 PM   Ann Stevens 07/14/1976 [id number]   HPI: 49 y.o. female here for initial evaluation of hematuria   PCP referred for microhematuria with acute LUTS  - several UAs over the last 4 years with pyuria + microhematuria  - limited culture data  First time meeting today, very pleasant patient  -Describes 2-28-month history of fluctuating LUTS, can be acute and self limited  - Acute urgency, low volume voids  - Denies bothersome urinary issues prior to 3 months ago  - No history of recurrent UTI  -Denies gross hematuria  - No other GU conditions, no gynecologic issues, no prior GU surgeries  - Non-smoker, no family history of malignancies  UA 07/08/24 - +LE, neg nitrite, 11-20 WBC, 11-20 RBC,   - culture = coag netative staph, likely contaminant  - s/p Macrobid   UA 07/27/24 - +LE, neg nitrite, 21-50 WBC, 3-6 RBC, 5-10 squamous  - no culture  Not currently active menses    PMH: Past Medical History:  Diagnosis Date   Anemia, iron deficiency    HLD (hyperlipidemia) 04/23/2023    Surgical History: Past Surgical History:  Procedure Laterality Date   CESAREAN SECTION  12/05/94,05/26/02   CHOLECYSTECTOMY N/A 07/10/2019   Procedure: LAPAROSCOPIC CHOLECYSTECTOMY WITH INTRAOPERATIVE CHOLANGIOGRAM;  Surgeon: Marolyn Nest, MD;  Location: [facility] ORS;  Service: General;  Laterality: N/A;   COLONOSCOPY WITH PROPOFOL  N/A 05/01/2021   Procedure: COLONOSCOPY WITH PROPOFOL ;  Surgeon: Janalyn Keene NOVAK, MD;  Location: [facility] END[facility]COPY;  Service: Endoscopy;  Laterality: N/A;    Family History: Family History  Problem Relation Age of Onset   Breast cancer Neg Hx    Colon cancer Neg Hx    Thyroid  cancer Neg Hx     Social History:  reports that she has never smoked. She has never used smokeless tobacco. She reports current alcohol use. She reports that she does not use drugs.      Physical Exam: BP 123/81   Pulse 86   Ht 5' 4 (1.626 m)   Wt 170 lb (77.1 kg)    BMI 29.18 kg/m    Constitutional:  Alert and oriented, No acute distress. Cardiovascular: No clubbing, cyanosis, or edema. Respiratory: Normal respiratory effort, no increased work of breathing. GI: Nondistended Skin: No rashes, bruises or suspicious lesions. Neurologic: Grossly intact, no focal deficits, moving all 4 extremities. Psychiatric: Normal mood and affect.  Laboratory Data: UA 07/08/24 - +LE, neg nitrite, 11-20 WBC, 11-20 RBC,   - culture = coag netative staph, likely contaminant  - s/p Macrobid   UA 07/27/24 - +LE, neg nitrite, 21-50 WBC, 3-6 RBC, 5-10 squamous  - no culture  UA today 08/04/2018 12-29-1928 WBC, 3-10 RBC,> 10 epithelial, few bacteria  Pertinent Imaging: N/A    Assessment & Plan:    Microhematuria Assessment & Plan: Microhematuria with fluctuating LUTS / pyuria  - limited culture data, ambiguous UAs  - likely UTI vs inflammatory cystitis   - low-risk clinical history  - catheterized UA/culture today -prior samples have had high squamous contamination - RBUS for baseline anatomy  - Discussed the role of diagnostic cystoscopy-Will defer for now although certainly consider if equivocal urinalysis, ongoing symptoms -RTC in ~2-3 weeks once RBUS complete  Orders: -     Urinalysis, Complete -     Urinalysis, Complete -     US  RENAL; Future -     CULTURE, URINE COMPREHENSIVE      Penne Skye, MD 08/04/2024  [facility] [id number] [place], [place] [address] [phone] "

## 2024-08-04 ENCOUNTER — Encounter: Payer: Self-pay | Admitting: Urology

## 2024-08-04 ENCOUNTER — Ambulatory Visit: Admitting: Urology

## 2024-08-04 VITALS — BP 123/81 | HR 86 | Ht 64.0 in | Wt 170.0 lb

## 2024-08-04 DIAGNOSIS — R3129 Other microscopic hematuria: Secondary | ICD-10-CM | POA: Diagnosis not present

## 2024-08-04 LAB — MICROSCOPIC EXAMINATION: Epithelial Cells (non renal): >10 /HPF — AB (ref 0–10)

## 2024-08-04 LAB — URINALYSIS, COMPLETE
Bilirubin, UA: NEGATIVE
Glucose, UA: NEGATIVE
Nitrite, UA: NEGATIVE
Specific Gravity, UA: 1.02 (ref 1.005–1.030)
Urobilinogen, Ur: 1 mg/dL (ref 0.2–1.0)
pH, UA: 7 (ref 5.0–7.5)

## 2024-08-04 NOTE — Patient Instructions (Signed)
 Please call [phone] to schedule your imaging prior to your appointment. Please allow time for your imaging results as this can take up 2-3 weeks to review. A follow up appointment as already been scheduled for the doctor to review results with you.

## 2024-08-04 NOTE — Progress Notes (Signed)
 In and Out Catheterization  Patient is present today for a I & O catheterization due to unable to give a clean urine sample. Patient was cleaned and prepped in a sterile fashion with betadine. A 14FR cath was inserted complications were noted. Patient only had a couple drops of urine come out her urethra. Patient did not have enough urine for a sample, less than 2ml of urine return was noted, urine was dark yellow in color. A clean urine sample was attempted to be collected. Bladder was drained  And catheter was removed with out difficulty.    Performed by: Myrtha Joanathan Affeldt,CMA  Follow up/ Additional notes: Follow up with office visit in 1 month for RBUS Results.

## 2024-08-05 ENCOUNTER — Ambulatory Visit
Admission: RE | Admit: 2024-08-05 | Discharge: 2024-08-05 | Disposition: A | Discharge status: Home / Self Care | Source: Ambulatory Visit | Attending: Urology | Admitting: Urology

## 2024-08-05 DIAGNOSIS — R3129 Other microscopic hematuria: Secondary | ICD-10-CM | POA: Insufficient documentation

## 2024-08-07 LAB — CULTURE, URINE COMPREHENSIVE

## 2024-08-10 ENCOUNTER — Other Ambulatory Visit: Payer: Self-pay | Admitting: Family

## 2024-08-10 DIAGNOSIS — N921 Excessive and frequent menstruation with irregular cycle: Secondary | ICD-10-CM

## 2024-08-13 ENCOUNTER — Other Ambulatory Visit: Payer: Self-pay | Admitting: Family

## 2024-08-13 DIAGNOSIS — E119 Type 2 diabetes mellitus without complications: Secondary | ICD-10-CM

## 2024-09-07 ENCOUNTER — Ambulatory Visit: Admitting: Urology

## 2024-11-22 ENCOUNTER — Ambulatory Visit: Admitting: Family
